# Patient Record
Sex: Female | Born: 1935 | Race: White | Hispanic: No | Smoking: Never smoker
Health system: Southern US, Community
[De-identification: ages and names within clinical notes are randomized; demographics above are authoritative.]

## PROBLEM LIST (undated history)

## (undated) DIAGNOSIS — E785 Hyperlipidemia, unspecified: Secondary | ICD-10-CM

## (undated) DIAGNOSIS — F32A Depression, unspecified: Secondary | ICD-10-CM

## (undated) DIAGNOSIS — I639 Cerebral infarction, unspecified: Secondary | ICD-10-CM

## (undated) DIAGNOSIS — E669 Obesity, unspecified: Secondary | ICD-10-CM

## (undated) DIAGNOSIS — Z8614 Personal history of Methicillin resistant Staphylococcus aureus infection: Secondary | ICD-10-CM

## (undated) DIAGNOSIS — M1990 Unspecified osteoarthritis, unspecified site: Secondary | ICD-10-CM

## (undated) DIAGNOSIS — E079 Disorder of thyroid, unspecified: Secondary | ICD-10-CM

## (undated) DIAGNOSIS — I272 Pulmonary hypertension, unspecified: Secondary | ICD-10-CM

## (undated) DIAGNOSIS — I10 Essential (primary) hypertension: Secondary | ICD-10-CM

## (undated) DIAGNOSIS — C801 Malignant (primary) neoplasm, unspecified: Secondary | ICD-10-CM

## (undated) DIAGNOSIS — F329 Major depressive disorder, single episode, unspecified: Secondary | ICD-10-CM

## (undated) HISTORY — DX: Unspecified osteoarthritis, unspecified site: M19.90

## (undated) HISTORY — DX: Pulmonary hypertension, unspecified: I27.20

## (undated) HISTORY — DX: Cerebral infarction, unspecified: I63.9

## (undated) HISTORY — DX: Obesity, unspecified: E66.9

## (undated) HISTORY — DX: Personal history of Methicillin resistant Staphylococcus aureus infection: Z86.14

## (undated) HISTORY — DX: Disorder of thyroid, unspecified: E07.9

## (undated) HISTORY — DX: Essential (primary) hypertension: I10

## (undated) HISTORY — DX: Depression, unspecified: F32.A

## (undated) HISTORY — DX: Major depressive disorder, single episode, unspecified: F32.9

## (undated) HISTORY — DX: Hyperlipidemia, unspecified: E78.5

## (undated) HISTORY — PX: PACEMAKER INSERTION: SHX728

## (undated) HISTORY — DX: Malignant (primary) neoplasm, unspecified: C80.1

---

## 1962-01-06 HISTORY — PX: CHOLECYSTECTOMY: SHX55

## 2001-01-06 HISTORY — PX: KNEE SURGERY: SHX244

## 2001-01-06 HISTORY — PX: COLONOSCOPY: SHX174

## 2003-01-07 HISTORY — PX: JOINT REPLACEMENT: SHX530

## 2003-10-07 ENCOUNTER — Encounter: Payer: Self-pay | Admitting: Internal Medicine

## 2004-01-07 HISTORY — PX: FOOT SURGERY: SHX648

## 2004-06-03 ENCOUNTER — Ambulatory Visit: Payer: Self-pay | Admitting: Internal Medicine

## 2004-06-18 ENCOUNTER — Ambulatory Visit: Payer: Self-pay | Admitting: Internal Medicine

## 2004-08-20 ENCOUNTER — Ambulatory Visit: Payer: Self-pay | Admitting: Otolaryngology

## 2005-01-06 HISTORY — PX: SHOULDER SURGERY: SHX246

## 2005-08-25 ENCOUNTER — Ambulatory Visit: Payer: Self-pay | Admitting: Internal Medicine

## 2005-10-28 ENCOUNTER — Ambulatory Visit: Payer: Self-pay | Admitting: Gastroenterology

## 2006-02-14 ENCOUNTER — Emergency Department: Payer: Self-pay | Admitting: Emergency Medicine

## 2006-02-14 ENCOUNTER — Other Ambulatory Visit: Payer: Self-pay

## 2006-10-29 ENCOUNTER — Ambulatory Visit: Payer: Self-pay | Admitting: Internal Medicine

## 2007-02-15 ENCOUNTER — Ambulatory Visit: Payer: Self-pay | Admitting: Internal Medicine

## 2007-11-04 ENCOUNTER — Ambulatory Visit: Payer: Self-pay | Admitting: Internal Medicine

## 2008-01-07 DIAGNOSIS — C801 Malignant (primary) neoplasm, unspecified: Secondary | ICD-10-CM

## 2008-01-07 HISTORY — DX: Malignant (primary) neoplasm, unspecified: C80.1

## 2008-01-07 HISTORY — PX: BREAST SURGERY: SHX581

## 2008-01-14 ENCOUNTER — Ambulatory Visit: Payer: Self-pay | Admitting: Internal Medicine

## 2008-11-09 ENCOUNTER — Ambulatory Visit: Payer: Self-pay | Admitting: Internal Medicine

## 2008-11-16 ENCOUNTER — Ambulatory Visit: Payer: Self-pay | Admitting: Internal Medicine

## 2008-11-21 ENCOUNTER — Ambulatory Visit: Payer: Self-pay | Admitting: Internal Medicine

## 2008-12-26 ENCOUNTER — Ambulatory Visit: Payer: Self-pay | Admitting: Cardiology

## 2008-12-27 ENCOUNTER — Ambulatory Visit: Payer: Self-pay | Admitting: Cardiology

## 2008-12-28 ENCOUNTER — Ambulatory Visit: Payer: Self-pay | Admitting: General Surgery

## 2009-01-02 ENCOUNTER — Inpatient Hospital Stay: Payer: Self-pay | Admitting: General Surgery

## 2009-01-06 ENCOUNTER — Ambulatory Visit: Payer: Self-pay | Admitting: Oncology

## 2009-01-29 ENCOUNTER — Ambulatory Visit: Payer: Self-pay | Admitting: Oncology

## 2009-02-06 ENCOUNTER — Ambulatory Visit: Payer: Self-pay | Admitting: Oncology

## 2009-02-08 ENCOUNTER — Ambulatory Visit: Payer: Self-pay | Admitting: General Surgery

## 2009-03-05 ENCOUNTER — Ambulatory Visit: Payer: Self-pay | Admitting: Oncology

## 2009-03-06 ENCOUNTER — Ambulatory Visit: Payer: Self-pay | Admitting: Oncology

## 2009-04-05 ENCOUNTER — Ambulatory Visit: Payer: Self-pay | Admitting: Internal Medicine

## 2009-05-06 ENCOUNTER — Ambulatory Visit: Payer: Self-pay | Admitting: Oncology

## 2009-05-23 ENCOUNTER — Encounter: Payer: Self-pay | Admitting: Internal Medicine

## 2009-06-05 ENCOUNTER — Ambulatory Visit: Payer: Self-pay | Admitting: Oncology

## 2009-06-06 ENCOUNTER — Encounter: Payer: Self-pay | Admitting: Internal Medicine

## 2009-07-06 ENCOUNTER — Encounter: Payer: Self-pay | Admitting: Internal Medicine

## 2009-08-06 ENCOUNTER — Encounter: Payer: Self-pay | Admitting: Internal Medicine

## 2009-09-06 ENCOUNTER — Encounter: Payer: Self-pay | Admitting: Internal Medicine

## 2009-09-11 ENCOUNTER — Ambulatory Visit: Payer: Self-pay | Admitting: Oncology

## 2009-10-06 ENCOUNTER — Ambulatory Visit: Payer: Self-pay | Admitting: Oncology

## 2009-10-06 ENCOUNTER — Encounter: Payer: Self-pay | Admitting: Internal Medicine

## 2009-12-12 ENCOUNTER — Ambulatory Visit: Payer: Self-pay | Admitting: Oncology

## 2009-12-19 ENCOUNTER — Ambulatory Visit: Payer: Self-pay | Admitting: Internal Medicine

## 2010-01-06 ENCOUNTER — Ambulatory Visit: Payer: Self-pay | Admitting: Oncology

## 2010-01-06 HISTORY — PX: SKIN CANCER EXCISION: SHX779

## 2010-04-16 ENCOUNTER — Ambulatory Visit: Payer: Self-pay | Admitting: Oncology

## 2010-06-13 ENCOUNTER — Ambulatory Visit: Payer: Self-pay | Admitting: Oncology

## 2010-07-07 ENCOUNTER — Ambulatory Visit: Payer: Self-pay | Admitting: Oncology

## 2010-07-23 ENCOUNTER — Ambulatory Visit: Payer: Self-pay | Admitting: General Surgery

## 2010-08-08 ENCOUNTER — Ambulatory Visit: Payer: Self-pay | Admitting: General Surgery

## 2010-08-27 ENCOUNTER — Other Ambulatory Visit: Payer: Self-pay | Admitting: Internal Medicine

## 2010-08-27 MED ORDER — LEVOTHYROXINE SODIUM 125 MCG PO TABS: 125.0000 ug | ORAL_TABLET | Freq: Every day | ORAL | Status: DC

## 2010-08-27 MED ORDER — LOSARTAN POTASSIUM 100 MG PO TABS: 100.0000 mg | ORAL_TABLET | Freq: Every day | ORAL | Status: DC

## 2010-09-16 ENCOUNTER — Ambulatory Visit: Payer: Medicare Other

## 2010-10-17 ENCOUNTER — Ambulatory Visit (INDEPENDENT_AMBULATORY_CARE_PROVIDER_SITE_OTHER): Payer: Medicare Other | Admitting: Internal Medicine

## 2010-10-17 ENCOUNTER — Encounter: Payer: Self-pay | Admitting: Internal Medicine

## 2010-10-17 VITALS — BP 138/82 | HR 60 | Temp 98.7°F | Resp 20 | Wt 199.8 lb

## 2010-10-17 DIAGNOSIS — E669 Obesity, unspecified: Secondary | ICD-10-CM

## 2010-10-17 DIAGNOSIS — F329 Major depressive disorder, single episode, unspecified: Secondary | ICD-10-CM | POA: Insufficient documentation

## 2010-10-17 DIAGNOSIS — F32A Depression, unspecified: Secondary | ICD-10-CM | POA: Insufficient documentation

## 2010-10-17 NOTE — Progress Notes (Signed)
Subjective:    Patient ID: Bianca Gomez, female    DOB: 1927/09/11, 77 y.o.   MRN: [id number]  HPI Bianca Gomez is an 77 year old female who presents for followup. She notes recent worsening of her depression in the setting of low self-esteem related to her obesity. She reports that her obesity significantly limits her ability to function. She notes that she is now short of breath walking from her house to her car. Because of neuropathy in her arms and legs her ability to exercise is limited. She is interested in learning about diet and exercise interventions to help her lose weight. She has been considering enrolling in a swimming program.  Outpatient Encounter Prescriptions as of 10/17/2010  Medication Sig Dispense Refill  . aspirin 81 MG tablet Take 81 mg by mouth daily.        . Calcium Carbonate-Vitamin D (CALCIUM 600-D) 600-400 MG-UNIT per tablet Take 2 tablets by mouth daily.        . Cholecalciferol (VITAMIN D) 2000 UNITS CAPS Take 1 capsule by mouth daily.        . fluticasone (FLONASE) 50 MCG/ACT nasal spray Place 2 sprays into the nose daily.        . folic acid (FOLVITE) 400 MCG tablet Take 400 mcg by mouth daily.        Marland Kitchen letrozole (FEMARA) 2.5 MG tablet Take 2.5 mg by mouth daily.        Marland Kitchen levothyroxine (SYNTHROID, LEVOTHROID) 125 MCG tablet Take 1 tablet (125 mcg total) by mouth daily.  30 tablet  6  . losartan (COZAAR) 100 MG tablet Take 1 tablet (100 mg total) by mouth daily.  30 tablet  6  . Multiple Vitamin (MULTIVITAMIN) capsule Take 1 capsule by mouth daily.        . Oxcarbazepine (TRILEPTAL) 300 MG tablet Take 300 mg by mouth 2 (two) times daily.        . sertraline (ZOLOFT) 100 MG tablet Take 100 mg by mouth daily. Takes 2 at bedtime        . vitamin C (ASCORBIC ACID) 500 MG tablet Take 500 mg by mouth daily.          Review of Systems  Constitutional: Negative for fever, chills, appetite change, fatigue and unexpected weight change.  HENT: Negative for ear pain,  congestion, sore throat, trouble swallowing, neck pain, voice change and sinus pressure.   Eyes: Negative for visual disturbance.  Respiratory: Positive for shortness of breath (on exertion). Negative for cough, wheezing and stridor.   Cardiovascular: Negative for chest pain, palpitations and leg swelling.  Gastrointestinal: Negative for nausea, vomiting, abdominal pain, diarrhea, constipation, blood in stool, abdominal distention and anal bleeding.  Genitourinary: Negative for dysuria and flank pain.  Musculoskeletal: Positive for gait problem. Negative for myalgias and arthralgias.  Skin: Negative for color change and rash.  Neurological: Positive for tremors and weakness. Negative for dizziness and headaches.  Hematological: Negative for adenopathy. Does not bruise/bleed easily.  Psychiatric/Behavioral: Positive for dysphoric mood. Negative for suicidal ideas and sleep disturbance. The patient is nervous/anxious.    BP 138/82  Pulse 60  Temp(Src) 98.7 F (37.1 C) (Oral)  Resp 20  Wt 199 lb 12 oz (90.606 kg)  SpO2 97%     Objective:   Physical Exam  Constitutional: She is oriented to person, place, and time. She appears well-developed and well-nourished. No distress.  HENT:  Head: Normocephalic and atraumatic.  Right Ear: External ear normal.  Left Ear:  External ear normal.  Nose: Nose normal.  Mouth/Throat: Oropharynx is clear and moist. No oropharyngeal exudate.  Eyes: Conjunctivae are normal. Pupils are equal, round, and reactive to light. Right eye exhibits no discharge. Left eye exhibits no discharge. No scleral icterus.  Neck: Normal range of motion. Neck supple. No tracheal deviation present. No thyromegaly present.  Cardiovascular: Normal rate, regular rhythm, normal heart sounds and intact distal pulses.  Exam reveals no gallop and no friction rub.   No murmur heard. Pulmonary/Chest: Effort normal and breath sounds normal. No respiratory distress. She has no wheezes. She  has no rales. She exhibits no tenderness.  Musculoskeletal: Normal range of motion. She exhibits no edema and no tenderness.  Lymphadenopathy:    She has no cervical adenopathy.  Neurological: She is alert and oriented to person, place, and time. No cranial nerve deficit. She exhibits normal muscle tone. Coordination normal.  Skin: Skin is warm and dry. No rash noted. She is not diaphoretic. No erythema. No pallor.  Psychiatric: Her speech is normal and behavior is normal. Judgment and thought content normal. Cognition and memory are normal. She exhibits a depressed mood.          Assessment & Plan:  1. Obesity -discussed options today to help her with diet and exercise. Encouraged her to count calories with a goal calorie intake per day of 1000-1200. Gave her information on a local aquatic center. Also gave information on a local personal trainer which might be of assistance to her. We discussed the option of physical and occupational therapy, however she has done this in the past and does not wish to do this again. We will have her followup in one month.   2. Depression -Worsened in the setting of low self-esteem because of limited ability to function. Gave her reference of the book "Feeling good" by Lawerance Bach for her to review. This is a book on cognitive behavioral therapy. She is already taking Zoloft. She has been followed by psychiatry in the past. She would prefer not to add additional medications at this time. We will have her followup in one month.

## 2010-11-13 ENCOUNTER — Ambulatory Visit (INDEPENDENT_AMBULATORY_CARE_PROVIDER_SITE_OTHER): Payer: Medicare Other | Admitting: Internal Medicine

## 2010-11-13 DIAGNOSIS — M810 Age-related osteoporosis without current pathological fracture: Secondary | ICD-10-CM

## 2010-11-13 MED ORDER — DENOSUMAB 60 MG/ML ~~LOC~~ SOLN
60.0000 mg | Freq: Once | SUBCUTANEOUS | Status: AC
  Administered 2010-11-13: 60 mg via SUBCUTANEOUS

## 2011-01-07 DIAGNOSIS — I272 Pulmonary hypertension, unspecified: Secondary | ICD-10-CM

## 2011-01-07 HISTORY — PX: PHRENIC NERVE PACEMAKER IMPLANTATION: SHX2237

## 2011-01-07 HISTORY — DX: Pulmonary hypertension, unspecified: I27.20

## 2011-01-16 ENCOUNTER — Ambulatory Visit: Payer: Self-pay | Admitting: Oncology

## 2011-01-20 ENCOUNTER — Telehealth: Payer: Self-pay | Admitting: Internal Medicine

## 2011-01-20 NOTE — Telephone Encounter (Signed)
Work  [phone] After  12    [phone]  Pt called has questions on her meds.  And has ? On PA, pt would like to check with dr walker about some the things the PA wants her to do Pt would like appointment before Thursday

## 2011-01-21 NOTE — Telephone Encounter (Signed)
Yes, she should take her thyroid medication with water only

## 2011-01-21 NOTE — Telephone Encounter (Signed)
Pt called back this morning wanted to speak to dr walkers nurse w [phone]    After 1 [phone]

## 2011-01-21 NOTE — Telephone Encounter (Signed)
Patient notified

## 2011-01-21 NOTE — Telephone Encounter (Signed)
Pharm advised patient to avoid calcium 4 hours after taking thyroid med. She will take her calcium at a different time but should she avoid her regular dairy intake also?

## 2011-01-23 ENCOUNTER — Other Ambulatory Visit: Payer: Self-pay | Admitting: *Deleted

## 2011-01-23 MED ORDER — SERTRALINE HCL 100 MG PO TABS: 100.0000 mg | ORAL_TABLET | Freq: Every day | ORAL | Status: DC

## 2011-01-23 MED ORDER — OXCARBAZEPINE 300 MG PO TABS: ORAL_TABLET | ORAL | Status: DC

## 2011-02-03 ENCOUNTER — Ambulatory Visit (INDEPENDENT_AMBULATORY_CARE_PROVIDER_SITE_OTHER)
Admission: RE | Admit: 2011-02-03 | Discharge: 2011-02-03 | Disposition: A | Discharge status: Home / Self Care | Payer: Medicare Other | Source: Ambulatory Visit | Attending: Internal Medicine | Admitting: Internal Medicine

## 2011-02-03 ENCOUNTER — Ambulatory Visit (INDEPENDENT_AMBULATORY_CARE_PROVIDER_SITE_OTHER): Payer: Medicare Other | Admitting: Internal Medicine

## 2011-02-03 ENCOUNTER — Telehealth: Payer: Self-pay | Admitting: *Deleted

## 2011-02-03 ENCOUNTER — Encounter: Payer: Self-pay | Admitting: Internal Medicine

## 2011-02-03 VITALS — BP 178/88 | HR 65 | Temp 98.6°F | Ht 60.0 in | Wt 211.0 lb

## 2011-02-03 DIAGNOSIS — M25512 Pain in left shoulder: Secondary | ICD-10-CM | POA: Insufficient documentation

## 2011-02-03 DIAGNOSIS — M25519 Pain in unspecified shoulder: Secondary | ICD-10-CM

## 2011-02-03 MED ORDER — HYDROCODONE-ACETAMINOPHEN 5-500 MG PO TABS: 1.0000 | ORAL_TABLET | Freq: Three times a day (TID) | ORAL | Status: AC | PRN

## 2011-02-03 NOTE — Telephone Encounter (Signed)
Ok for wk in - shoulder eval only at 1:15 today. Patient informed

## 2011-02-03 NOTE — Telephone Encounter (Signed)
Patient requesting work in apt today for shoulder pain. She fell last week and c/o increasing pain. Please advise.

## 2011-02-03 NOTE — Assessment & Plan Note (Signed)
S/p fall left upper arm.  Severe pain anterior left shoulder. Note, s/p left shoulder replacement.  Will get plain xray to evaluate fracture.  Will start vicodin prn pain.

## 2011-02-03 NOTE — Progress Notes (Signed)
Subjective:    Patient ID: Bianca Gomez, female    DOB: 1927/08/11, 78 y.o.   MRN: [id number]  HPI 78YO female with h/o neuropathy secondary to chemotherapy and s/p left shoulder replacement presents for acute visit after fall last Wednesday in which she tripped on sidewalk and fell onto left shoulder. Notes severe pain in left shoulder since that time. Unable to abduct arm. Decreased strength in biceps and triceps.  No LOC during fall. No other noted injuries.  Outpatient Encounter Prescriptions as of 02/03/2011  Medication Sig Dispense Refill  . aspirin 81 MG tablet Take 81 mg by mouth daily.        . Calcium Carbonate-Vitamin D (CALCIUM 600-D) 600-400 MG-UNIT per tablet Take 2 tablets by mouth daily.        . Cholecalciferol (VITAMIN D) 2000 UNITS CAPS Take 1 capsule by mouth daily.        . fluticasone (FLONASE) 50 MCG/ACT nasal spray Place 2 sprays into the nose daily.        . folic acid (FOLVITE) 400 MCG tablet Take 400 mcg by mouth daily.        Marland Kitchen letrozole (FEMARA) 2.5 MG tablet Take 2.5 mg by mouth daily.        Marland Kitchen levothyroxine (SYNTHROID, LEVOTHROID) 125 MCG tablet Take 1 tablet (125 mcg total) by mouth daily.  30 tablet  6  . losartan (COZAAR) 100 MG tablet Take 1 tablet (100 mg total) by mouth daily.  30 tablet  6  . Multiple Vitamin (MULTIVITAMIN) capsule Take 1 capsule by mouth daily.        . Oxcarbazepine (TRILEPTAL) 300 MG tablet 1 tablet in the AM and 2 in the evening  270 tablet  1  . sertraline (ZOLOFT) 100 MG tablet Take 1 tablet (100 mg total) by mouth daily. Takes 2 at bedtime  90 tablet  1  . vitamin C (ASCORBIC ACID) 500 MG tablet Take 500 mg by mouth daily.        Marland Kitchen HYDROcodone-acetaminophen (VICODIN) 5-500 MG per tablet Take 1 tablet by mouth every 8 (eight) hours as needed for pain.  20 tablet  0    Review of Systems  Constitutional: Negative for fever, chills and fatigue.  HENT: Negative for neck pain and neck stiffness.   Respiratory: Negative for chest  tightness and shortness of breath.   Cardiovascular: Negative for chest pain.  Musculoskeletal: Positive for myalgias and arthralgias.  Skin: Negative for color change and wound.  Neurological: Positive for weakness and numbness. Negative for light-headedness.   BP 178/88  Pulse 65  Temp(Src) 98.6 F (37 C) (Oral)  Ht 5' (1.524 m)  Wt 211 lb (95.709 kg)  BMI 41.21 kg/m2  SpO2 97%     Objective:   Physical Exam  Constitutional: She is oriented to person, place, and time. She appears well-developed and well-nourished. No distress.  HENT:  Head: Normocephalic and atraumatic.  Right Ear: External ear normal.  Left Ear: External ear normal.  Nose: Nose normal.  Mouth/Throat: Oropharynx is clear and moist. No oropharyngeal exudate.  Eyes: Conjunctivae are normal. Pupils are equal, round, and reactive to light. Right eye exhibits no discharge. Left eye exhibits no discharge. No scleral icterus.  Neck: Normal range of motion. Neck supple. No tracheal deviation present. No thyromegaly present.  Cardiovascular: Normal rate, regular rhythm, normal heart sounds and intact distal pulses.  Exam reveals no gallop and no friction rub.   No murmur heard. Pulmonary/Chest: Effort normal  and breath sounds normal. No respiratory distress. She has no wheezes. She has no rales. She exhibits no tenderness.  Musculoskeletal: She exhibits no edema and no tenderness.       Left shoulder: She exhibits decreased range of motion, tenderness, bony tenderness, pain and decreased strength (4/5 biceps, triceps, shoulder abductors). She exhibits no swelling, no effusion and no crepitus.  Lymphadenopathy:    She has no cervical adenopathy.  Neurological: She is alert and oriented to person, place, and time. No cranial nerve deficit. She exhibits normal muscle tone. Coordination normal.  Skin: Skin is warm and dry. No rash noted. She is not diaphoretic. No erythema. No pallor.  Psychiatric: She has a normal mood and  affect. Her behavior is normal. Judgment and thought content normal.          Assessment & Plan:

## 2011-02-03 NOTE — Telephone Encounter (Signed)
Opened in error

## 2011-02-04 ENCOUNTER — Telehealth: Payer: Self-pay | Admitting: Internal Medicine

## 2011-02-04 DIAGNOSIS — S4990XA Unspecified injury of shoulder and upper arm, unspecified arm, initial encounter: Secondary | ICD-10-CM

## 2011-02-04 DIAGNOSIS — M25512 Pain in left shoulder: Secondary | ICD-10-CM

## 2011-02-04 NOTE — Telephone Encounter (Signed)
Call patient at work with results.

## 2011-02-04 NOTE — Telephone Encounter (Signed)
No one at wk #, left pt vm on hm # that ortho referral was in place and she would be called w/details

## 2011-02-07 ENCOUNTER — Ambulatory Visit: Payer: Self-pay | Admitting: Oncology

## 2011-03-26 ENCOUNTER — Ambulatory Visit: Payer: Self-pay | Admitting: Internal Medicine

## 2011-03-26 ENCOUNTER — Telehealth: Payer: Self-pay | Admitting: *Deleted

## 2011-03-26 ENCOUNTER — Encounter: Payer: Self-pay | Admitting: Internal Medicine

## 2011-03-26 ENCOUNTER — Ambulatory Visit (INDEPENDENT_AMBULATORY_CARE_PROVIDER_SITE_OTHER): Payer: Medicare Other | Admitting: Internal Medicine

## 2011-03-26 VITALS — BP 172/80 | HR 67 | Temp 97.9°F | Wt 207.0 lb

## 2011-03-26 DIAGNOSIS — S0990XA Unspecified injury of head, initial encounter: Secondary | ICD-10-CM

## 2011-03-26 NOTE — Telephone Encounter (Signed)
Can you please let pt know

## 2011-03-26 NOTE — Telephone Encounter (Signed)
Call report - No acute intracranial changes, there are involutional changes. Report will be faxed over.

## 2011-03-26 NOTE — Assessment & Plan Note (Signed)
Patient is status post fall from standing position. She has large ecchymosis on her anterior for head and has complaints of blurred vision. Symptoms are concerning for subdural hematoma. Will get stat noncontrast CT of the head. She will continue to apply ice to her anterior forehead and left upper arm. We applied a Tegaderm dressing to her skin tear on her right knee today. She will followup in one week if the CT is negative. She will call immediately if symptoms of blurred vision are worsening or she develops new symptoms such as headache, nausea.

## 2011-03-26 NOTE — Progress Notes (Signed)
Subjective:    Patient ID: Bianca Gomez, female    DOB: July 25, 1927, 78 y.o.   MRN: [id number]  HPI  78 year old female presents as a walk-in acute visit after falling this morning at home at approximately 7 AM. She reports that she tripped on some carpet in her home and fell forward landing on her left arm and head. She reports that she fell hard enough that her head bounced off the floor. She has a large bruise and swelling over her forehead. She has been applying ice to this. She denies any loss of consciousness, nausea, or vomiting. She denies chest pain or shortness of breath. She does note some blurred vision. She also scratched her knee and notes some bleeding from this area.  Outpatient Encounter Prescriptions as of 03/26/2011  Medication Sig Dispense Refill  . aspirin 81 MG tablet Take 81 mg by mouth daily.        . Calcium Carbonate-Vitamin D (CALCIUM 600-D) 600-400 MG-UNIT per tablet Take 2 tablets by mouth daily.        . Cholecalciferol (VITAMIN D) 2000 UNITS CAPS Take 1 capsule by mouth daily.        . fluticasone (FLONASE) 50 MCG/ACT nasal spray Place 2 sprays into the nose daily.        . folic acid (FOLVITE) 400 MCG tablet Take 400 mcg by mouth daily.        Marland Kitchen letrozole (FEMARA) 2.5 MG tablet Take 2.5 mg by mouth daily.        Marland Kitchen levothyroxine (SYNTHROID, LEVOTHROID) 125 MCG tablet Take 1 tablet (125 mcg total) by mouth daily.  30 tablet  6  . losartan (COZAAR) 100 MG tablet Take 1 tablet (100 mg total) by mouth daily.  30 tablet  6  . Multiple Vitamin (MULTIVITAMIN) capsule Take 1 capsule by mouth daily.        . Oxcarbazepine (TRILEPTAL) 300 MG tablet 1 tablet in the AM and 2 in the evening  270 tablet  1  . sertraline (ZOLOFT) 100 MG tablet Take 1 tablet (100 mg total) by mouth daily. Takes 2 at bedtime  90 tablet  1  . vitamin C (ASCORBIC ACID) 500 MG tablet Take 500 mg by mouth daily.          Review of Systems  Constitutional: Negative for fever, chills, appetite  change, fatigue and unexpected weight change.  HENT: Negative for neck pain.   Eyes: Positive for visual disturbance. Negative for pain.  Respiratory: Negative for cough, shortness of breath, wheezing and stridor.   Cardiovascular: Negative for chest pain and palpitations.  Musculoskeletal: Positive for myalgias. Negative for arthralgias and gait problem.  Skin: Positive for color change and wound. Negative for rash.  Neurological: Positive for headaches. Negative for dizziness, tremors, speech difficulty, weakness and light-headedness.  Hematological: Negative for adenopathy. Bruises/bleeds easily.  Psychiatric/Behavioral: Negative for suicidal ideas, sleep disturbance and dysphoric mood. The patient is not nervous/anxious.    BP 172/80  Pulse 67  Temp(Src) 97.9 F (36.6 C) (Oral)  Wt 207 lb (93.895 kg)  SpO2 97%     Objective:   Physical Exam  Constitutional: She is oriented to person, place, and time. She appears well-developed and well-nourished. No distress.  HENT:  Head: Normocephalic and atraumatic.    Right Ear: External ear normal.  Left Ear: External ear normal.  Nose: Nose normal.  Mouth/Throat: Oropharynx is clear and moist. No oropharyngeal exudate.  Eyes: Conjunctivae are normal. Pupils are equal, round,  and reactive to light. Right eye exhibits no discharge. Left eye exhibits no discharge. No scleral icterus.  Neck: Normal range of motion. Neck supple. No tracheal deviation present. No thyromegaly present.  Cardiovascular: Normal rate, regular rhythm, normal heart sounds and intact distal pulses.  Exam reveals no gallop and no friction rub.   No murmur heard. Pulmonary/Chest: Effort normal and breath sounds normal. No respiratory distress. She has no wheezes. She has no rales. She exhibits no tenderness.  Musculoskeletal: Normal range of motion. She exhibits no edema and no tenderness.  Lymphadenopathy:    She has no cervical adenopathy.  Neurological: She is  alert and oriented to person, place, and time. No cranial nerve deficit. She exhibits normal muscle tone. Coordination normal.  Skin: Skin is warm and dry. Bruising, ecchymosis and laceration noted. No rash noted. She is not diaphoretic. There is erythema. No pallor.     Psychiatric: She has a normal mood and affect. Her behavior is normal. Judgment and thought content normal.          Assessment & Plan:

## 2011-03-27 NOTE — Telephone Encounter (Signed)
Patient informed, She is sore today but overall ok. Pt advised to call w/any changes

## 2011-03-31 ENCOUNTER — Telehealth: Payer: Self-pay | Admitting: Internal Medicine

## 2011-03-31 NOTE — Telephone Encounter (Signed)
Left message notifying patient.

## 2011-03-31 NOTE — Telephone Encounter (Signed)
[phone] Pt wanted to know if you still want her to follow in one week.  We made appointment for 4/3 @ 1:15 it this ok or do you need to see her sooner?

## 2011-03-31 NOTE — Telephone Encounter (Signed)
If she is feeling okay, then we can wait until 4/3

## 2011-04-01 ENCOUNTER — Encounter: Payer: Self-pay | Admitting: Internal Medicine

## 2011-04-09 ENCOUNTER — Ambulatory Visit (INDEPENDENT_AMBULATORY_CARE_PROVIDER_SITE_OTHER): Payer: Medicare Other | Admitting: Internal Medicine

## 2011-04-09 ENCOUNTER — Telehealth: Payer: Self-pay | Admitting: Internal Medicine

## 2011-04-09 ENCOUNTER — Encounter: Payer: Self-pay | Admitting: Internal Medicine

## 2011-04-09 VITALS — BP 118/62 | HR 73 | Temp 98.4°F | Ht 60.0 in | Wt 211.0 lb

## 2011-04-09 DIAGNOSIS — E039 Hypothyroidism, unspecified: Secondary | ICD-10-CM

## 2011-04-09 DIAGNOSIS — R519 Headache, unspecified: Secondary | ICD-10-CM | POA: Insufficient documentation

## 2011-04-09 DIAGNOSIS — R2681 Unsteadiness on feet: Secondary | ICD-10-CM

## 2011-04-09 DIAGNOSIS — D649 Anemia, unspecified: Secondary | ICD-10-CM

## 2011-04-09 DIAGNOSIS — R269 Unspecified abnormalities of gait and mobility: Secondary | ICD-10-CM

## 2011-04-09 DIAGNOSIS — E785 Hyperlipidemia, unspecified: Secondary | ICD-10-CM

## 2011-04-09 DIAGNOSIS — R51 Headache: Secondary | ICD-10-CM | POA: Insufficient documentation

## 2011-04-09 LAB — COMPREHENSIVE METABOLIC PANEL
Albumin: 3.8 g/dL (ref 3.5–5.2)
Alkaline Phosphatase: 74 U/L (ref 39–117)
BUN: 14 mg/dL (ref 6–23)
CO2: 25 mEq/L (ref 19–32)
Chloride: 96 mEq/L (ref 96–112)
GFR: 92.91 mL/min (ref >60.00)
Glucose, Bld: 126 mg/dL — ABNORMAL HIGH (ref 70–99)
Total Bilirubin: 0.5 mg/dL (ref 0.3–1.2)
Total Protein: 7.2 g/dL (ref 6.0–8.3)

## 2011-04-09 LAB — CBC WITH DIFFERENTIAL/PLATELET
Basophils Absolute: 0 10*3/uL (ref 0.0–0.1)
HCT: 38 % (ref 36.0–46.0)
Lymphocytes Relative: 10.5 % — ABNORMAL LOW (ref 12.0–46.0)
Lymphs Abs: 0.5 10*3/uL — ABNORMAL LOW (ref 0.7–4.0)
Monocytes Relative: 11.9 % (ref 3.0–12.0)
Platelets: 162 10*3/uL (ref 150.0–400.0)
RDW: 13.2 % (ref 11.5–14.6)

## 2011-04-09 LAB — LIPID PANEL
Cholesterol: 157 mg/dL (ref 0–200)
Triglycerides: 67 mg/dL (ref 0.0–149.0)

## 2011-04-09 LAB — TSH: TSH: 0.65 u[IU]/mL (ref 0.35–5.50)

## 2011-04-09 NOTE — Telephone Encounter (Signed)
She also has history of dizziness/recurrent falls.  That should work for diagnosis.  Thanks for setting this up.

## 2011-04-09 NOTE — Assessment & Plan Note (Addendum)
Daily diffuse headache. Suspect related to stress/tension. Recent CT head after fall was normal. Question if she might be anemic from extensive bruising.  Will check CBC, CMP, TSH with labs. Will continue prn tylenol/vicodin for severe headache. Follow up 1 month.

## 2011-04-09 NOTE — Assessment & Plan Note (Addendum)
Chronic. Patient went through vestibular therapy with minimal improvement. Continues to have some lightheadedness and gait instability with recurrent falls. Will get carotid Dopplers to evaluate for TIA. Recent CT brain was normal. Consider MRI brain if symptoms persistent.

## 2011-04-09 NOTE — Telephone Encounter (Signed)
Called [facility] for the cartoid dopplers.  They said the diagnosis of headache would not be a payable diagnosis for the insurance for the dopplers.  Could not find anything in the notes.  Would you ask Dr Dan Humphreys how soon these need to be done and is there another reason other than just headache

## 2011-04-09 NOTE — Progress Notes (Signed)
Subjective:    Patient ID: Bianca Gomez, female    DOB: 30-Sep-1927, 78 y.o.   MRN: [id number]  HPI 78YO female with h/o hypertension, hypothyroidism and recent fall 2 weeks ago presents for follow up. She notes that bruising from her fall over her forehead and upper left arm has improved. Her CT head was normal.  She notes that over the last several weeks, she has had almost daily headaches, described as diffuse. She has also had some gait instability and recurrent falls. She previously went through vestibular therapy with some improvement. She denies any neck pain. She denies any visual changes or preceding aura for her headaches. She denies any nausea or vomiting associated with headaches. She denies chest pain or shortness of breath.  Outpatient Encounter Prescriptions as of 04/09/2011  Medication Sig Dispense Refill  . aspirin 81 MG tablet Take 81 mg by mouth daily.        . Calcium Carbonate-Vitamin D (CALCIUM 600-D) 600-400 MG-UNIT per tablet Take 2 tablets by mouth daily.        . Cholecalciferol (VITAMIN D) 2000 UNITS CAPS Take 1 capsule by mouth daily.        . fluticasone (FLONASE) 50 MCG/ACT nasal spray Place 2 sprays into the nose daily.        . folic acid (FOLVITE) 400 MCG tablet Take 400 mcg by mouth daily.        Marland Kitchen letrozole (FEMARA) 2.5 MG tablet Take 2.5 mg by mouth daily.        Marland Kitchen levothyroxine (SYNTHROID, LEVOTHROID) 125 MCG tablet Take 1 tablet (125 mcg total) by mouth daily.  30 tablet  6  . losartan (COZAAR) 100 MG tablet Take 1 tablet (100 mg total) by mouth daily.  30 tablet  6  . Multiple Vitamin (MULTIVITAMIN) capsule Take 1 capsule by mouth daily.        . Oxcarbazepine (TRILEPTAL) 300 MG tablet 1 tablet in the AM and 2 in the evening  270 tablet  1  . sertraline (ZOLOFT) 100 MG tablet Take 1 tablet (100 mg total) by mouth daily. Takes 2 at bedtime  90 tablet  1  . vitamin C (ASCORBIC ACID) 500 MG tablet Take 500 mg by mouth daily.          Review of Systems    Constitutional: Positive for fatigue. Negative for fever, chills, appetite change and unexpected weight change.  HENT: Negative for ear pain, congestion, sore throat, trouble swallowing, neck pain, voice change and sinus pressure.   Eyes: Negative for visual disturbance.  Respiratory: Negative for cough, shortness of breath, wheezing and stridor.   Cardiovascular: Negative for chest pain, palpitations and leg swelling.  Gastrointestinal: Negative for nausea, vomiting, abdominal pain, diarrhea, constipation, blood in stool, abdominal distention and anal bleeding.  Genitourinary: Negative for dysuria and flank pain.  Musculoskeletal: Negative for myalgias, arthralgias and gait problem.  Skin: Negative for color change and rash.  Neurological: Positive for headaches. Negative for dizziness.  Hematological: Negative for adenopathy. Does not bruise/bleed easily.  Psychiatric/Behavioral: Negative for suicidal ideas, sleep disturbance and dysphoric mood. The patient is nervous/anxious.    BP 118/62  Pulse 73  Temp(Src) 98.4 F (36.9 C) (Oral)  Ht 5' (1.524 m)  Wt 211 lb (95.709 kg)  BMI 41.21 kg/m2  SpO2 95%     Objective:   Physical Exam  Constitutional: She is oriented to person, place, and time. She appears well-developed and well-nourished. No distress.  HENT:  Head:  Normocephalic and atraumatic.  Right Ear: External ear normal.  Left Ear: External ear normal.  Nose: Nose normal.  Mouth/Throat: Oropharynx is clear and moist. No oropharyngeal exudate.  Eyes: Conjunctivae are normal. Pupils are equal, round, and reactive to light. Right eye exhibits no discharge. Left eye exhibits no discharge. No scleral icterus.  Neck: Normal range of motion. Neck supple. No tracheal deviation present. No thyromegaly present.  Cardiovascular: Normal rate, regular rhythm, normal heart sounds and intact distal pulses.  Exam reveals no gallop and no friction rub.   No murmur heard. Pulmonary/Chest:  Effort normal and breath sounds normal. No respiratory distress. She has no wheezes. She has no rales. She exhibits no tenderness.  Abdominal: Soft. Bowel sounds are normal. She exhibits no distension. There is no tenderness.  Musculoskeletal: Normal range of motion. She exhibits no edema and no tenderness.  Lymphadenopathy:    She has no cervical adenopathy.  Neurological: She is alert and oriented to person, place, and time. No cranial nerve deficit. She exhibits normal muscle tone. Coordination normal.  Skin: Skin is warm and dry. Ecchymosis (over forehead and left upper arm) noted. No rash noted. She is not diaphoretic. No erythema. No pallor.  Psychiatric: She has a normal mood and affect. Her behavior is normal. Judgment and thought content normal.          Assessment & Plan:

## 2011-04-15 ENCOUNTER — Telehealth: Payer: Self-pay | Admitting: Internal Medicine

## 2011-04-15 NOTE — Telephone Encounter (Signed)
Carotid doppler right 50-69%, left 1-49% stenosis

## 2011-04-18 ENCOUNTER — Encounter: Payer: Self-pay | Admitting: Internal Medicine

## 2011-04-21 ENCOUNTER — Telehealth: Payer: Self-pay | Admitting: *Deleted

## 2011-04-21 NOTE — Telephone Encounter (Signed)
Patient notified. I have left a Ventolin Inhaler up front for her to pick up . She will call tomorrow for sooner appt if symptoms are no better or worsening.

## 2011-04-21 NOTE — Telephone Encounter (Signed)
Patient is feeling short winded she is only able to take short breathes, having some wheezing. She wasn't been feeling good all last week. She is coughing just a little, no fever. She has an appt on wed to see you. She is asking what she can do until she is seen. She says that a couple of years ago she had this problem and was given an inhaler to use and she felt that it really helped. She is asking if maybe she could get an inhaler, maybe even a sample if we have it. Please advise.

## 2011-04-21 NOTE — Telephone Encounter (Signed)
Fine to either give her a sample or call in Albuterol inhaler for her to use 2 puffs tid prn.  However, if symptoms severe/worsening, she probably needs to be seen sooner.

## 2011-04-23 ENCOUNTER — Encounter: Payer: Self-pay | Admitting: Internal Medicine

## 2011-04-23 ENCOUNTER — Inpatient Hospital Stay: Payer: Self-pay | Admitting: Internal Medicine

## 2011-04-23 ENCOUNTER — Ambulatory Visit (INDEPENDENT_AMBULATORY_CARE_PROVIDER_SITE_OTHER): Payer: Medicare Other | Admitting: Internal Medicine

## 2011-04-23 DIAGNOSIS — R0989 Other specified symptoms and signs involving the circulatory and respiratory systems: Secondary | ICD-10-CM

## 2011-04-23 DIAGNOSIS — R079 Chest pain, unspecified: Secondary | ICD-10-CM | POA: Insufficient documentation

## 2011-04-23 DIAGNOSIS — R0600 Dyspnea, unspecified: Secondary | ICD-10-CM

## 2011-04-23 LAB — COMPREHENSIVE METABOLIC PANEL
Albumin: 3.5 g/dL (ref 3.4–5.0)
Alkaline Phosphatase: 97 U/L (ref 50–136)
Anion Gap: 9 (ref 7–16)
BUN: 13 mg/dL (ref 7–18)
Bilirubin,Total: 0.5 mg/dL (ref 0.2–1.0)
Co2: 28 mmol/L (ref 21–32)
Creatinine: 0.61 mg/dL (ref 0.60–1.30)
EGFR (African American): >60
EGFR (Non-African Amer.): >60
Osmolality: 266 (ref 275–301)
SGOT(AST): 26 U/L (ref 15–37)
SGPT (ALT): 22 U/L
Sodium: 132 mmol/L — ABNORMAL LOW (ref 136–145)
Total Protein: 7.7 g/dL (ref 6.4–8.2)

## 2011-04-23 LAB — PRO B NATRIURETIC PEPTIDE: B-Type Natriuretic Peptide: 413 pg/mL (ref 0–450)

## 2011-04-23 LAB — CBC
HCT: 38.5 % (ref 35.0–47.0)
HGB: 12.8 g/dL (ref 12.0–16.0)
MCHC: 33.2 g/dL (ref 32.0–36.0)
MCV: 92 fL (ref 80–100)
Platelet: 190 10*3/uL (ref 150–440)
RBC: 4.17 10*6/uL (ref 3.80–5.20)
RDW: 13.5 % (ref 11.5–14.5)
WBC: 5.4 10*3/uL (ref 3.6–11.0)

## 2011-04-23 LAB — TROPONIN I: Troponin-I: <0.02 ng/mL

## 2011-04-23 LAB — CK TOTAL AND CKMB (NOT AT ARMC): CK, Total: 61 U/L (ref 21–215)

## 2011-04-23 NOTE — Assessment & Plan Note (Signed)
Marked dyspnea on exam. Air movement is generally good with only few crackles noted.  Symptoms are concerning for MI or PE given pt h/o breast CA and use of Femara. EKG shows no acute abnormality. Pt was placed on supplemental oxygen by , then by non-rebreather, and sent by EMS to [facility] ED for evaluation. Will need cardiac markers, CXR and CT or VQ scan to eval PE.

## 2011-04-23 NOTE — Assessment & Plan Note (Signed)
Pt c/o chest pain described as band-like pressure around anterior chest.  EKG shows no acute abnormalities. However, pt has multiple risk factors for CAD, and is markedly dyspneic. Pt was given Aspirin 325mg  po in office and placed on oxygen 2.5L [place]. Pt was sent by EMS to [facility] ED for further evaluation to include cardiac markers, CXR, and likely CT chest given concern for PE.

## 2011-04-23 NOTE — Progress Notes (Signed)
Subjective:    Patient ID: Bianca Gomez, female    DOB: 11-17-1927, 78 y.o.   MRN: [id number]  HPI 78 year old female with history of hypertension and hypothyroidism as well as breast cancer status post mastectomy presents for an acute visit complaining of two-week history of shortness of breath and wheezing. She initially started an albuterol inhaler at home with some improvement. However, this morning she is markedly dyspneic. She has difficulty speaking more than 2 words at a time. She notes tightness across her anterior chest which she describes as bandlike. She has not taken any medication for this. She also notes nausea but no vomiting. She is diaphoretic.  Outpatient Encounter Prescriptions as of 04/23/2011  Medication Sig Dispense Refill  . aspirin 81 MG tablet Take 81 mg by mouth daily.        . Calcium Carbonate-Vitamin D (CALCIUM 600-D) 600-400 MG-UNIT per tablet Take 2 tablets by mouth daily.        . Cholecalciferol (VITAMIN D) 2000 UNITS CAPS Take 1 capsule by mouth daily.        . fluticasone (FLONASE) 50 MCG/ACT nasal spray Place 2 sprays into the nose daily.        . folic acid (FOLVITE) 400 MCG tablet Take 400 mcg by mouth daily.        Marland Kitchen letrozole (FEMARA) 2.5 MG tablet Take 2.5 mg by mouth daily.        Marland Kitchen levothyroxine (SYNTHROID, LEVOTHROID) 125 MCG tablet Take 1 tablet (125 mcg total) by mouth daily.  30 tablet  6  . losartan (COZAAR) 100 MG tablet Take 1 tablet (100 mg total) by mouth daily.  30 tablet  6  . Multiple Vitamin (MULTIVITAMIN) capsule Take 1 capsule by mouth daily.        . Oxcarbazepine (TRILEPTAL) 300 MG tablet 1 tablet in the AM and 2 in the evening  270 tablet  1  . sertraline (ZOLOFT) 100 MG tablet Take 1 tablet (100 mg total) by mouth daily. Takes 2 at bedtime  90 tablet  1  . vitamin C (ASCORBIC ACID) 500 MG tablet Take 500 mg by mouth daily.          Review of Systems  Constitutional: Positive for fatigue. Negative for fever and chills.    Respiratory: Positive for cough, shortness of breath and wheezing.   Cardiovascular: Positive for chest pain. Negative for palpitations and leg swelling.  Gastrointestinal: Positive for nausea. Negative for vomiting and abdominal pain.  Skin: Negative for color change.   BP 168/80  Pulse 68  Temp(Src) 98.1 F (36.7 C) (Oral)  Ht 5' (1.524 m)  Wt 203 lb (92.08 kg)  BMI 39.65 kg/m2  SpO2 98% RR 30     Objective:   Physical Exam  Constitutional: She is oriented to person, place, and time. She appears well-developed and well-nourished. She has a sickly appearance. She appears ill. She appears distressed.  HENT:  Head: Normocephalic and atraumatic.  Right Ear: External ear normal.  Left Ear: External ear normal.  Nose: Nose normal.  Mouth/Throat: Oropharynx is clear and moist. No oropharyngeal exudate.  Eyes: Conjunctivae are normal. Pupils are equal, round, and reactive to light. Right eye exhibits no discharge. Left eye exhibits no discharge. No scleral icterus.  Neck: Normal range of motion. Neck supple. No tracheal deviation present. No thyromegaly present.  Cardiovascular: Normal rate, regular rhythm, normal heart sounds and intact distal pulses.  Exam reveals no gallop and no friction rub.  No murmur heard. Pulmonary/Chest: Accessory muscle usage present. Tachypnea noted. She is in respiratory distress. She has no decreased breath sounds. She has no wheezes. She has rales. She exhibits no tenderness.  Abdominal: Soft. She exhibits no distension. There is no tenderness.  Musculoskeletal: Normal range of motion. She exhibits no edema and no tenderness.  Lymphadenopathy:    She has no cervical adenopathy.  Neurological: She is alert and oriented to person, place, and time. No cranial nerve deficit. She exhibits normal muscle tone. Coordination normal.  Skin: Skin is warm. No rash noted. She is diaphoretic. No erythema. No pallor.  Psychiatric: Her speech is normal and behavior is  normal. Judgment and thought content normal. Her mood appears anxious.          Assessment & Plan:

## 2011-04-24 LAB — LIPID PANEL: Cholesterol: 146 mg/dL (ref 0–200)

## 2011-04-24 LAB — CBC WITH DIFFERENTIAL/PLATELET
Basophil #: 0 10*3/uL (ref 0.0–0.1)
Basophil %: 0.3 %
Eosinophil %: 0 %
HCT: 39.5 % (ref 35.0–47.0)
Lymphocyte #: 0.6 10*3/uL — ABNORMAL LOW (ref 1.0–3.6)
Lymphocyte %: 13.9 %
Monocyte #: 0.2 x10 3/mm (ref 0.2–0.9)
RDW: 13.4 % (ref 11.5–14.5)

## 2011-04-24 LAB — TROPONIN I: Troponin-I: <0.02 ng/mL

## 2011-04-24 LAB — BASIC METABOLIC PANEL
Anion Gap: 11 (ref 7–16)
Chloride: 96 mmol/L — ABNORMAL LOW (ref 98–107)
Co2: 25 mmol/L (ref 21–32)
EGFR (Non-African Amer.): >60
Potassium: 4.2 mmol/L (ref 3.5–5.1)
Sodium: 132 mmol/L — ABNORMAL LOW (ref 136–145)

## 2011-04-25 LAB — BASIC METABOLIC PANEL
Anion Gap: 10 (ref 7–16)
BUN: 20 mg/dL — ABNORMAL HIGH (ref 7–18)
Co2: 28 mmol/L (ref 21–32)
EGFR (African American): >60
EGFR (Non-African Amer.): >60
Glucose: 135 mg/dL — ABNORMAL HIGH (ref 65–99)
Potassium: 4.1 mmol/L (ref 3.5–5.1)
Sodium: 133 mmol/L — ABNORMAL LOW (ref 136–145)

## 2011-04-28 ENCOUNTER — Encounter: Payer: Self-pay | Admitting: Internal Medicine

## 2011-05-07 ENCOUNTER — Encounter: Payer: Self-pay | Admitting: Internal Medicine

## 2011-05-07 ENCOUNTER — Ambulatory Visit: Payer: Self-pay | Admitting: Internal Medicine

## 2011-05-12 ENCOUNTER — Telehealth: Payer: Self-pay | Admitting: *Deleted

## 2011-05-12 NOTE — Telephone Encounter (Signed)
Recived form for prior authorization for Prolia. PA Form completed, signed by MD & faxed to [facility] for approval/SLS

## 2011-05-13 NOTE — Telephone Encounter (Signed)
Received approval, LM/sent staff msge for Bianca Gomez [Prolia] as to how to proceed and left contact name & number for [employer]; informed CMAs at [facility] office/SLS

## 2011-05-15 LAB — URINALYSIS, COMPLETE
Bilirubin,UR: NEGATIVE
Ketone: NEGATIVE
Leukocyte Esterase: NEGATIVE
Ph: 7 (ref 4.5–8.0)
Protein: NEGATIVE
Specific Gravity: 1.029 (ref 1.003–1.030)
Squamous Epithelial: <1
WBC UR: <1 /HPF (ref 0–5)

## 2011-05-15 LAB — CBC
MCH: 31.8 pg (ref 26.0–34.0)
Platelet: 143 10*3/uL — ABNORMAL LOW (ref 150–440)
RBC: 3.96 10*6/uL (ref 3.80–5.20)
WBC: 9.3 10*3/uL (ref 3.6–11.0)

## 2011-05-15 LAB — COMPREHENSIVE METABOLIC PANEL
Albumin: 2.9 g/dL — ABNORMAL LOW (ref 3.4–5.0)
Alkaline Phosphatase: 79 U/L (ref 50–136)
Calcium, Total: 8.7 mg/dL (ref 8.5–10.1)
Chloride: 100 mmol/L (ref 98–107)
Creatinine: 0.6 mg/dL (ref 0.60–1.30)
EGFR (African American): >60
EGFR (Non-African Amer.): >60
SGOT(AST): 33 U/L (ref 15–37)
SGPT (ALT): 29 U/L
Sodium: 134 mmol/L — ABNORMAL LOW (ref 136–145)
Total Protein: 6.7 g/dL (ref 6.4–8.2)

## 2011-05-15 LAB — PRO B NATRIURETIC PEPTIDE: B-Type Natriuretic Peptide: 3049 pg/mL — ABNORMAL HIGH (ref 0–450)

## 2011-05-16 LAB — CK-MB
CK-MB: 1.5 ng/mL (ref 0.5–3.6)
CK-MB: 1.2 ng/mL (ref 0.5–3.6)

## 2011-05-16 LAB — BASIC METABOLIC PANEL
BUN: 9 mg/dL (ref 7–18)
Co2: 24 mmol/L (ref 21–32)
Creatinine: 0.58 mg/dL — ABNORMAL LOW (ref 0.60–1.30)
EGFR (Non-African Amer.): >60
Glucose: 97 mg/dL (ref 65–99)
Osmolality: 269 (ref 275–301)
Sodium: 135 mmol/L — ABNORMAL LOW (ref 136–145)

## 2011-05-16 LAB — CBC WITH DIFFERENTIAL/PLATELET
Eosinophil #: 0.2 10*3/uL (ref 0.0–0.7)
HCT: 35.3 % (ref 35.0–47.0)
HGB: 12.2 g/dL (ref 12.0–16.0)
Lymphocyte #: 0.7 10*3/uL — ABNORMAL LOW (ref 1.0–3.6)
MCH: 32 pg (ref 26.0–34.0)
Monocyte #: 0.6 x10 3/mm (ref 0.2–0.9)
Monocyte %: 8.1 %
RBC: 3.81 10*6/uL (ref 3.80–5.20)
RDW: 14 % (ref 11.5–14.5)

## 2011-05-16 LAB — TROPONIN I
Troponin-I: <0.02 ng/mL
Troponin-I: <0.02 ng/mL

## 2011-05-18 ENCOUNTER — Ambulatory Visit: Payer: Self-pay | Admitting: Neurology

## 2011-05-19 ENCOUNTER — Inpatient Hospital Stay: Payer: Self-pay | Admitting: Internal Medicine

## 2011-05-19 LAB — BASIC METABOLIC PANEL
Anion Gap: 8 (ref 7–16)
BUN: 9 mg/dL (ref 7–18)
Calcium, Total: 8.6 mg/dL (ref 8.5–10.1)
Chloride: 96 mmol/L — ABNORMAL LOW (ref 98–107)
Co2: 30 mmol/L (ref 21–32)
Creatinine: 0.6 mg/dL (ref 0.60–1.30)
EGFR (African American): >60
EGFR (Non-African Amer.): >60
Glucose: 111 mg/dL — ABNORMAL HIGH (ref 65–99)

## 2011-05-20 ENCOUNTER — Telehealth: Payer: Self-pay | Admitting: *Deleted

## 2011-05-20 LAB — BASIC METABOLIC PANEL
Calcium, Total: 8.4 mg/dL — ABNORMAL LOW (ref 8.5–10.1)
Chloride: 94 mmol/L — ABNORMAL LOW (ref 98–107)
Creatinine: 0.61 mg/dL (ref 0.60–1.30)
EGFR (Non-African Amer.): >60
Glucose: 102 mg/dL — ABNORMAL HIGH (ref 65–99)
Osmolality: 272 (ref 275–301)
Potassium: 3.7 mmol/L (ref 3.5–5.1)
Sodium: 136 mmol/L (ref 136–145)

## 2011-05-20 LAB — PRO B NATRIURETIC PEPTIDE: B-Type Natriuretic Peptide: 1984 pg/mL — ABNORMAL HIGH (ref 0–450)

## 2011-05-20 NOTE — Telephone Encounter (Signed)
Gave approval information to Bianca Gomez so that patient's Prolia can be ordered to be administered during 05.20.13 CPE visit [costwise]; LMOM to inform patient that we were ordering medication/SLS

## 2011-05-21 LAB — PLATELET COUNT: Platelet: 223 10*3/uL (ref 150–440)

## 2011-05-22 LAB — BASIC METABOLIC PANEL
Anion Gap: 11 (ref 7–16)
BUN: 24 mg/dL — ABNORMAL HIGH (ref 7–18)
Calcium, Total: 9.6 mg/dL (ref 8.5–10.1)
Chloride: 91 mmol/L — ABNORMAL LOW (ref 98–107)
Creatinine: 0.67 mg/dL (ref 0.60–1.30)
EGFR (African American): >60
EGFR (Non-African Amer.): >60
Glucose: 121 mg/dL — ABNORMAL HIGH (ref 65–99)
Potassium: 3.4 mmol/L — ABNORMAL LOW (ref 3.5–5.1)

## 2011-05-22 LAB — CBC WITH DIFFERENTIAL/PLATELET
Basophil %: 0.1 %
Eosinophil #: 0 10*3/uL (ref 0.0–0.7)
HGB: 12.4 g/dL (ref 12.0–16.0)
Lymphocyte #: 0.8 10*3/uL — ABNORMAL LOW (ref 1.0–3.6)
Lymphocyte %: 8.1 %
MCH: 31.3 pg (ref 26.0–34.0)
MCV: 91 fL (ref 80–100)
Monocyte #: 0.8 x10 3/mm (ref 0.2–0.9)
Neutrophil #: 8 10*3/uL — ABNORMAL HIGH (ref 1.4–6.5)
Neutrophil %: 83.2 %
Platelet: 278 10*3/uL (ref 150–440)
RBC: 3.95 10*6/uL (ref 3.80–5.20)

## 2011-05-23 LAB — BASIC METABOLIC PANEL
BUN: 24 mg/dL — ABNORMAL HIGH (ref 7–18)
Calcium, Total: 9.6 mg/dL (ref 8.5–10.1)
Chloride: 92 mmol/L — ABNORMAL LOW (ref 98–107)
Creatinine: 0.67 mg/dL (ref 0.60–1.30)
EGFR (African American): >60
Osmolality: 278 (ref 275–301)
Potassium: 4 mmol/L (ref 3.5–5.1)

## 2011-05-23 LAB — MAGNESIUM: Magnesium: 2 mg/dL

## 2011-05-24 LAB — BASIC METABOLIC PANEL
BUN: 34 mg/dL — ABNORMAL HIGH (ref 7–18)
Calcium, Total: 9.9 mg/dL (ref 8.5–10.1)
Chloride: 89 mmol/L — ABNORMAL LOW (ref 98–107)
Creatinine: 0.68 mg/dL (ref 0.60–1.30)
EGFR (African American): >60
EGFR (Non-African Amer.): >60
Potassium: 3.7 mmol/L (ref 3.5–5.1)
Sodium: 131 mmol/L — ABNORMAL LOW (ref 136–145)

## 2011-05-25 LAB — BASIC METABOLIC PANEL
Anion Gap: 10 (ref 7–16)
BUN: 36 mg/dL — ABNORMAL HIGH (ref 7–18)
Chloride: 90 mmol/L — ABNORMAL LOW (ref 98–107)
Co2: 33 mmol/L — ABNORMAL HIGH (ref 21–32)
Creatinine: 0.65 mg/dL (ref 0.60–1.30)
EGFR (African American): >60
EGFR (Non-African Amer.): >60
Glucose: 131 mg/dL — ABNORMAL HIGH (ref 65–99)
Potassium: 3.8 mmol/L (ref 3.5–5.1)
Sodium: 133 mmol/L — ABNORMAL LOW (ref 136–145)

## 2011-05-25 LAB — OSMOLALITY: Osmolality: 285 mOsm/kg (ref 280–301)

## 2011-05-26 ENCOUNTER — Encounter: Payer: Medicare Other | Admitting: Internal Medicine

## 2011-05-27 LAB — OSMOLALITY, URINE: Osmolality: 487 mOsm/kg

## 2011-05-28 LAB — CBC WITH DIFFERENTIAL/PLATELET
Basophil #: 0 10*3/uL (ref 0.0–0.1)
Basophil %: 0.1 %
Eosinophil #: 0.1 10*3/uL (ref 0.0–0.7)
Eosinophil %: 1.4 %
HCT: 41.7 % (ref 35.0–47.0)
HGB: 13.7 g/dL (ref 12.0–16.0)
Lymphocyte #: 1.1 10*3/uL (ref 1.0–3.6)
Lymphocyte %: 10.8 %
MCHC: 32.7 g/dL (ref 32.0–36.0)
Monocyte #: 0.8 x10 3/mm (ref 0.2–0.9)
Monocyte %: 7.5 %
Neutrophil #: 8 10*3/uL — ABNORMAL HIGH (ref 1.4–6.5)
Platelet: 268 10*3/uL (ref 150–440)
RDW: 14.6 % — ABNORMAL HIGH (ref 11.5–14.5)

## 2011-05-28 LAB — BASIC METABOLIC PANEL
Anion Gap: 9 (ref 7–16)
Anion Gap: 5 — ABNORMAL LOW (ref 7–16)
Calcium, Total: 8.4 mg/dL — ABNORMAL LOW (ref 8.5–10.1)
Chloride: 95 mmol/L — ABNORMAL LOW (ref 98–107)
Co2: 33 mmol/L — ABNORMAL HIGH (ref 21–32)
Co2: 37 mmol/L — ABNORMAL HIGH (ref 21–32)
Creatinine: 0.8 mg/dL (ref 0.60–1.30)
EGFR (African American): >60
EGFR (Non-African Amer.): >60
EGFR (Non-African Amer.): >60
Glucose: 162 mg/dL — ABNORMAL HIGH (ref 65–99)
Glucose: 106 mg/dL — ABNORMAL HIGH (ref 65–99)
Osmolality: 280 (ref 275–301)
Osmolality: 279 (ref 275–301)
Potassium: 3.5 mmol/L (ref 3.5–5.1)
Sodium: 135 mmol/L — ABNORMAL LOW (ref 136–145)
Sodium: 137 mmol/L (ref 136–145)

## 2011-05-28 LAB — APTT: Activated PTT: 25.8 secs (ref 23.6–35.9)

## 2011-05-28 LAB — HEMOGLOBIN A1C: Hemoglobin A1C: 6.1 % (ref 4.2–6.3)

## 2011-05-28 LAB — PROTIME-INR
INR: 1
Prothrombin Time: 13.1 secs (ref 11.5–14.7)

## 2011-05-29 LAB — BASIC METABOLIC PANEL
Anion Gap: 9 (ref 7–16)
Calcium, Total: 9 mg/dL (ref 8.5–10.1)
Chloride: 93 mmol/L — ABNORMAL LOW (ref 98–107)
Creatinine: 0.75 mg/dL (ref 0.60–1.30)
EGFR (African American): >60
EGFR (Non-African Amer.): >60
Glucose: 107 mg/dL — ABNORMAL HIGH (ref 65–99)
Potassium: 3.6 mmol/L (ref 3.5–5.1)

## 2011-05-30 ENCOUNTER — Telehealth: Payer: Self-pay | Admitting: Internal Medicine

## 2011-05-30 NOTE — Telephone Encounter (Signed)
Per verbal, Carollee Herter will make f/u discharge phone call within 48 hr period/SLS

## 2011-05-30 NOTE — Telephone Encounter (Signed)
Patient is being discharged from the hospital today she has an appointment on Tuesday with Dr. Dan Humphreys.

## 2011-06-03 ENCOUNTER — Ambulatory Visit: Payer: Medicare Other | Admitting: Internal Medicine

## 2011-06-03 ENCOUNTER — Telehealth: Payer: Self-pay | Admitting: Internal Medicine

## 2011-06-03 ENCOUNTER — Telehealth: Payer: Self-pay | Admitting: *Deleted

## 2011-06-03 NOTE — Telephone Encounter (Signed)
After being unable to reach Pt at home and/or work number listed, [I called [facility], but they had no different number]; I was able to get a contact name & number via [facility] patient accounting: Bianca Gomez [phone], at which I [facility] with contact name & call back number concerning missed appointment this morning by patient & inability to reach Pt at home/SLS

## 2011-06-03 NOTE — Telephone Encounter (Signed)
Caller: Linda/Emergency Contact; PCP: Ronna Polio; CB#: [phone] regarding Returning Call To Office;

## 2011-06-03 NOTE — Telephone Encounter (Signed)
Attempted to reach patient via home phone, no answer-no ans machine, "finally states mailbox is full" after multiple rings; LMOM [general] at work number given [facility]]; no emergency contact listed in Pt demographics, to f/u on No Show follow-up post hospital visit scheduled today 05.28.13 @ 10:00am/SLS

## 2011-06-03 NOTE — Telephone Encounter (Signed)
Can you make sure Ms. Poznanski is okay?

## 2011-06-04 ENCOUNTER — Telehealth: Payer: Self-pay | Admitting: Internal Medicine

## 2011-06-04 NOTE — Telephone Encounter (Signed)
Error  See note dated 5/28

## 2011-06-04 NOTE — Telephone Encounter (Signed)
Bianca Gomez employer called, Bianca Gomez had left message yesterday trying to locate her.  Bianca Gomez is in [place] [facility] [place] [facility] rehab.   Employer also stated that Bianca Gomez retired a couple of month ago.  She was in hospital and they send her to [facility] for rehab

## 2011-06-07 ENCOUNTER — Ambulatory Visit: Payer: Self-pay | Admitting: Internal Medicine

## 2011-06-11 ENCOUNTER — Ambulatory Visit: Payer: Self-pay | Admitting: Internal Medicine

## 2011-06-19 ENCOUNTER — Encounter: Payer: Self-pay | Admitting: Internal Medicine

## 2011-06-19 LAB — URINALYSIS, COMPLETE
Hyaline Cast: 3
Ketone: NEGATIVE
Leukocyte Esterase: NEGATIVE
Nitrite: NEGATIVE
Ph: 7 (ref 4.5–8.0)
Protein: NEGATIVE
Squamous Epithelial: 1
WBC UR: 10 /HPF (ref 0–5)

## 2011-06-25 ENCOUNTER — Ambulatory Visit: Payer: Self-pay | Admitting: Internal Medicine

## 2011-06-26 LAB — GENTAMICIN LEVEL, PEAK: Gentamicin, Peak: 3.9 ug/mL — ABNORMAL LOW (ref 4.0–8.0)

## 2011-06-26 LAB — GENTAMICIN LEVEL, TROUGH: Gentamicin, Trough: 0.7 ug/mL (ref 0.0–2.0)

## 2011-06-30 LAB — BASIC METABOLIC PANEL
Anion Gap: 7 (ref 7–16)
BUN: 11 mg/dL (ref 7–18)
Calcium, Total: 8.3 mg/dL — ABNORMAL LOW (ref 8.5–10.1)
Co2: 32 mmol/L (ref 21–32)
EGFR (African American): >60
EGFR (Non-African Amer.): >60
Glucose: 76 mg/dL (ref 65–99)
Sodium: 137 mmol/L (ref 136–145)

## 2011-07-02 LAB — URINALYSIS, COMPLETE
Bilirubin,UR: NEGATIVE
Glucose,UR: NEGATIVE mg/dL (ref 0–75)
Hyaline Cast: 21
Ketone: NEGATIVE
Leukocyte Esterase: NEGATIVE
Nitrite: NEGATIVE
Ph: 7 (ref 4.5–8.0)
Protein: NEGATIVE
Specific Gravity: 1.005 (ref 1.003–1.030)
Squamous Epithelial: <1
WBC UR: <1 /HPF (ref 0–5)

## 2011-07-07 ENCOUNTER — Encounter: Payer: Self-pay | Admitting: Internal Medicine

## 2011-07-18 ENCOUNTER — Ambulatory Visit: Payer: Self-pay | Admitting: Specialist

## 2011-08-07 ENCOUNTER — Encounter: Payer: Self-pay | Admitting: Internal Medicine

## 2011-08-19 ENCOUNTER — Other Ambulatory Visit: Payer: Self-pay | Admitting: Internal Medicine

## 2011-08-22 ENCOUNTER — Telehealth: Payer: Self-pay | Admitting: Internal Medicine

## 2011-08-22 NOTE — Telephone Encounter (Signed)
This is not on patient's med list, is it okay to fill?

## 2011-08-22 NOTE — Telephone Encounter (Signed)
Refill on her potassium 25 mcq

## 2011-08-23 NOTE — Telephone Encounter (Signed)
Can you ask her if this is a new medication

## 2011-08-25 NOTE — Telephone Encounter (Signed)
I think patient is confused, she said she has been taking losartan potassium, but was told by the nursing home that she needs just potassium. Do you want me to bring her in to discuss meds?

## 2011-08-25 NOTE — Telephone Encounter (Signed)
Need to discuss first. Who started this?

## 2011-08-25 NOTE — Telephone Encounter (Signed)
Yes, please.

## 2011-08-25 NOTE — Telephone Encounter (Signed)
Patient notified. She has appt scheduled for 09-05-11 ans she says that she wants to keep that.

## 2011-08-25 NOTE — Telephone Encounter (Signed)
Patient says that this is a new medication.

## 2011-08-28 LAB — URINE CULTURE

## 2011-09-03 DIAGNOSIS — I509 Heart failure, unspecified: Secondary | ICD-10-CM

## 2011-09-03 DIAGNOSIS — I5033 Acute on chronic diastolic (congestive) heart failure: Secondary | ICD-10-CM

## 2011-09-03 DIAGNOSIS — I10 Essential (primary) hypertension: Secondary | ICD-10-CM

## 2011-09-05 ENCOUNTER — Encounter: Payer: Self-pay | Admitting: Internal Medicine

## 2011-09-05 ENCOUNTER — Other Ambulatory Visit: Payer: Self-pay | Admitting: *Deleted

## 2011-09-05 ENCOUNTER — Ambulatory Visit (INDEPENDENT_AMBULATORY_CARE_PROVIDER_SITE_OTHER): Payer: Medicare Other | Admitting: Internal Medicine

## 2011-09-05 VITALS — BP 118/82 | HR 91 | Temp 98.7°F | Ht 60.0 in | Wt 183.8 lb

## 2011-09-05 DIAGNOSIS — E2749 Other adrenocortical insufficiency: Secondary | ICD-10-CM

## 2011-09-05 DIAGNOSIS — R001 Bradycardia, unspecified: Secondary | ICD-10-CM

## 2011-09-05 DIAGNOSIS — I498 Other specified cardiac arrhythmias: Secondary | ICD-10-CM

## 2011-09-05 DIAGNOSIS — E2740 Unspecified adrenocortical insufficiency: Secondary | ICD-10-CM

## 2011-09-05 DIAGNOSIS — J8410 Pulmonary fibrosis, unspecified: Secondary | ICD-10-CM

## 2011-09-05 LAB — COMPREHENSIVE METABOLIC PANEL
ALT: 16 U/L (ref 0–35)
Albumin: 4 g/dL (ref 3.5–5.2)
Alkaline Phosphatase: 60 U/L (ref 39–117)
CO2: 29 mEq/L (ref 19–32)
GFR: 92.81 mL/min (ref >60.00)
Glucose, Bld: 120 mg/dL — ABNORMAL HIGH (ref 70–99)
Potassium: 3.5 mEq/L (ref 3.5–5.1)
Sodium: 139 mEq/L (ref 135–145)
Total Bilirubin: 0.3 mg/dL (ref 0.3–1.2)
Total Protein: 7.3 g/dL (ref 6.0–8.3)

## 2011-09-05 LAB — CBC WITH DIFFERENTIAL/PLATELET
Basophils Absolute: 0 10*3/uL (ref 0.0–0.1)
Eosinophils Absolute: 0 10*3/uL (ref 0.0–0.7)
HCT: 37.7 % (ref 36.0–46.0)
Hemoglobin: 12.6 g/dL (ref 12.0–15.0)
Lymphs Abs: 0.9 10*3/uL (ref 0.7–4.0)
MCHC: 33.3 g/dL (ref 30.0–36.0)
Monocytes Absolute: 0.4 10*3/uL (ref 0.1–1.0)
Monocytes Relative: 4.7 % (ref 3.0–12.0)
Neutro Abs: 7 10*3/uL (ref 1.4–7.7)
Platelets: 163 10*3/uL (ref 150.0–400.0)
RDW: 15.4 % — ABNORMAL HIGH (ref 11.5–14.6)

## 2011-09-05 LAB — MAGNESIUM: Magnesium: 1.9 mg/dL (ref 1.5–2.5)

## 2011-09-05 MED ORDER — BUDESONIDE-FORMOTEROL FUMARATE 160-4.5 MCG/ACT IN AERO: 2.0000 | INHALATION_SPRAY | Freq: Two times a day (BID) | RESPIRATORY_TRACT | Status: DC

## 2011-09-05 MED ORDER — MUCINEX DM 30-600 MG PO TB12: 1.0000 | ORAL_TABLET | Freq: Two times a day (BID) | ORAL | Status: DC

## 2011-09-05 MED ORDER — FLUDROCORTISONE ACETATE 0.1 MG PO TABS: 0.1000 mg | ORAL_TABLET | Freq: Every day | ORAL | Status: DC

## 2011-09-05 MED ORDER — ZOLPIDEM TARTRATE 5 MG PO TABS: 5.0000 mg | ORAL_TABLET | Freq: Every evening | ORAL | Status: DC | PRN

## 2011-09-05 MED ORDER — PREDNISONE 10 MG PO TABS: 10.0000 mg | ORAL_TABLET | Freq: Every day | ORAL | Status: DC

## 2011-09-05 MED ORDER — TORSEMIDE 10 MG PO TABS: 10.0000 mg | ORAL_TABLET | Freq: Every day | ORAL | Status: DC

## 2011-09-05 MED ORDER — TIOTROPIUM BROMIDE MONOHYDRATE 18 MCG IN CAPS: 18.0000 ug | ORAL_CAPSULE | Freq: Every day | RESPIRATORY_TRACT | Status: DC

## 2011-09-05 MED ORDER — MIDODRINE HCL 10 MG PO TABS: 10.0000 mg | ORAL_TABLET | Freq: Three times a day (TID) | ORAL | Status: DC

## 2011-09-05 NOTE — Telephone Encounter (Signed)
Rx called to pharmacy. Bianca Gomez has been notified.

## 2011-09-05 NOTE — Telephone Encounter (Signed)
We can call in Ambien 5mg  po qhs, disp 30 no refills. Will need follow up.

## 2011-09-05 NOTE — Telephone Encounter (Signed)
Jasmine December called stating that patient has been having trouble sleeping and she wanted to know if patient could get a Rx for Ambien.  She has been on Ambien before.  Please advise.

## 2011-09-06 DIAGNOSIS — R001 Bradycardia, unspecified: Secondary | ICD-10-CM | POA: Insufficient documentation

## 2011-09-06 DIAGNOSIS — E2740 Unspecified adrenocortical insufficiency: Secondary | ICD-10-CM | POA: Insufficient documentation

## 2011-09-06 DIAGNOSIS — J8410 Pulmonary fibrosis, unspecified: Secondary | ICD-10-CM | POA: Insufficient documentation

## 2011-09-06 NOTE — Assessment & Plan Note (Signed)
Pt recently hospitalized for acute bronchitis 04/2011, ultimately required re-admission for dyspnea and diagnosed with pulmonary fibrosis. Now on chronic supplemental oxygen. Doing well. Functional capacity improving. Will request records from pt pulmonologist. Follow up 2-4 weeks.

## 2011-09-06 NOTE — Assessment & Plan Note (Signed)
Pt diagnosed with symptomatic bradycardia s/p placement of pacemaker. Doing well. Asymptomatic. Will continue to monitor.

## 2011-09-06 NOTE — Assessment & Plan Note (Signed)
During recent hospitalization, pt developed adrenal insufficiency, now on supplemental steroid. Will request notes from hospitalization. Follow up 2-4 weeks.

## 2011-09-06 NOTE — Progress Notes (Signed)
Subjective:    Patient ID: Ferne Coe, female    DOB: April 24, 1927, 78 y.o.   MRN: [id number]  HPI 78YO female with recent prolonged hospitalization from 04/2011 through 08/2011, initially for acute bronchitis, then ultimately developing pulmonary fibrosis, adrenal insufficiency, and symptomatic bradycardia, presents for follow up. She is now at home, and has been followed by home health. She is receiving PT, OT and some assistance with ADLs.  She reports she is doing well.  She continues to have some shortness of breath, and is wearing supplemental oxygen 2L [place] 24hr per day.  She is working on increasing her ability to walk prolonged distances with PT.  She denies fever, chills, chest pain. Appetite is normal.   Outpatient Encounter Prescriptions as of 09/05/2011  Medication Sig Dispense Refill  . aspirin 81 MG tablet Take 81 mg by mouth daily.        . budesonide-formoterol (SYMBICORT) 160-4.5 MCG/ACT inhaler Inhale 2 puffs into the lungs 2 (two) times daily.  1 Inhaler  3  . Calcium Carbonate-Vitamin D (CALCIUM 600-D) 600-400 MG-UNIT per tablet Take 2 tablets by mouth daily.        . Cholecalciferol (VITAMIN D) 2000 UNITS CAPS Take 1 capsule by mouth daily.        Marland Kitchen Dextromethorphan-Guaifenesin (MUCINEX DM) 30-600 MG TB12 Take 1 tablet by mouth 2 (two) times daily.  28 each  0  . fludrocortisone (FLORINEF) 0.1 MG tablet Take 1 tablet (0.1 mg total) by mouth daily.  30 tablet  6  . fluticasone (FLONASE) 50 MCG/ACT nasal spray Place 2 sprays into the nose daily.        . folic acid (FOLVITE) 400 MCG tablet Take 400 mcg by mouth daily.        Marland Kitchen letrozole (FEMARA) 2.5 MG tablet Take 2.5 mg by mouth daily.        Marland Kitchen levothyroxine (SYNTHROID, LEVOTHROID) 125 MCG tablet TAKE 1 TABLET BY MOUTH EVERY DAY  30 tablet  5  . losartan (COZAAR) 100 MG tablet Take 1 tablet (100 mg total) by mouth daily.  30 tablet  6  . midodrine (PROAMATINE) 10 MG tablet Take 1 tablet (10 mg total) by mouth 3 (three) times  daily.  90 tablet  3  . Multiple Vitamin (MULTIVITAMIN) capsule Take 1 capsule by mouth daily.        . Oxcarbazepine (TRILEPTAL) 300 MG tablet 1 tablet in the AM and 2 in the evening  270 tablet  1  . predniSONE (DELTASONE) 10 MG tablet Take 1 tablet (10 mg total) by mouth daily.  30 tablet  0  . sertraline (ZOLOFT) 100 MG tablet Take 1 tablet (100 mg total) by mouth daily. Takes 2 at bedtime  90 tablet  1  . tiotropium (SPIRIVA HANDIHALER) 18 MCG inhalation capsule Place 1 capsule (18 mcg total) into inhaler and inhale daily.  30 capsule  12  . torsemide (DEMADEX) 10 MG tablet Take 1 tablet (10 mg total) by mouth daily.  30 tablet  3  . vitamin C (ASCORBIC ACID) 500 MG tablet Take 500 mg by mouth daily.         BP 118/82  Pulse 91  Temp 98.7 F (37.1 C) (Oral)  Ht 5' (1.524 m)  Wt 183 lb 12 oz (83.348 kg)  BMI 35.89 kg/m2  SpO2 98%  Review of Systems  Constitutional: Positive for fatigue. Negative for fever, chills, appetite change and unexpected weight change.  HENT: Negative for ear pain,  congestion, sore throat, trouble swallowing, neck pain, voice change and sinus pressure.   Eyes: Negative for visual disturbance.  Respiratory: Positive for shortness of breath. Negative for cough, wheezing and stridor.   Cardiovascular: Negative for chest pain, palpitations and leg swelling.  Gastrointestinal: Negative for nausea, vomiting, abdominal pain, diarrhea, constipation, blood in stool, abdominal distention and anal bleeding.  Genitourinary: Negative for dysuria and flank pain.  Musculoskeletal: Negative for myalgias, arthralgias and gait problem.  Skin: Negative for color change and rash.  Neurological: Negative for dizziness and headaches.  Hematological: Negative for adenopathy. Does not bruise/bleed easily.  Psychiatric/Behavioral: Negative for suicidal ideas, disturbed wake/sleep cycle and dysphoric mood. The patient is not nervous/anxious.        Objective:   Physical Exam    Constitutional: She is oriented to person, place, and time. She appears well-developed and well-nourished. No distress.  HENT:  Head: Normocephalic and atraumatic.  Right Ear: External ear normal.  Left Ear: External ear normal.  Nose: Nose normal.  Mouth/Throat: Oropharynx is clear and moist. No oropharyngeal exudate.  Eyes: Conjunctivae are normal. Pupils are equal, round, and reactive to light. Right eye exhibits no discharge. Left eye exhibits no discharge. No scleral icterus.  Neck: Normal range of motion. Neck supple. No tracheal deviation present. No thyromegaly present.  Cardiovascular: Normal rate, regular rhythm, normal heart sounds and intact distal pulses.  Exam reveals no gallop and no friction rub.   No murmur heard. Pulmonary/Chest: Effort normal. No accessory muscle usage. Not tachypneic. No respiratory distress. She has no decreased breath sounds. She has no wheezes. She has no rhonchi. She has rales (scattered). She exhibits no tenderness.  Musculoskeletal: Normal range of motion. She exhibits no edema and no tenderness.  Lymphadenopathy:    She has no cervical adenopathy.  Neurological: She is alert and oriented to person, place, and time. No cranial nerve deficit. She exhibits normal muscle tone. Coordination normal.  Skin: Skin is warm and dry. No rash noted. She is not diaphoretic. No erythema. No pallor.  Psychiatric: She has a normal mood and affect. Her behavior is normal. Judgment and thought content normal.          Assessment & Plan:

## 2011-09-12 ENCOUNTER — Telehealth: Payer: Self-pay | Admitting: Internal Medicine

## 2011-09-12 NOTE — Telephone Encounter (Signed)
Needing a call about patient's medication.

## 2011-09-15 NOTE — Telephone Encounter (Signed)
Bianca Gomez advised as instructed via telephone.

## 2011-09-15 NOTE — Telephone Encounter (Signed)
Spoke with Jasmine December and she stated that patient's BP has been running 138/88 and 140/90 Thursday and Friday.  She wanted to know if Dr. Dan Humphreys wants patient to continue current medications or can she be taken off any of the BP medication.  Patient is doing fine otherwise, she is weaning herself off the oxygen.  Please advise.

## 2011-09-15 NOTE — Telephone Encounter (Signed)
I think that she should continue current medications.

## 2011-09-18 ENCOUNTER — Telehealth: Payer: Self-pay | Admitting: *Deleted

## 2011-09-18 NOTE — Telephone Encounter (Signed)
We need to keep a close eye on her blood pressure. She was taken off her blood pressure medications in the hospital and it seems that blood pressure is trending up. Please asked nurse to continue to call us periodically with update on blood pressure readings. Preferably one to 2 times per week.

## 2011-09-18 NOTE — Telephone Encounter (Signed)
Bianca Gomez called to let Dr. Dan Humphreys know that she checked patients BP today 158/98 in right arm, 138/96 in left arm.  Heart rate between 50-60.  Patient has no symptoms and Bianca Gomez discussed signs and symptoms of a stroke and told patient to call 9-1-1 if these symptoms develop.

## 2011-09-19 NOTE — Telephone Encounter (Signed)
Left message on cell phone voicemail advising Jasmine December as instructed.

## 2011-09-22 ENCOUNTER — Ambulatory Visit (INDEPENDENT_AMBULATORY_CARE_PROVIDER_SITE_OTHER): Payer: Medicare Other | Admitting: Internal Medicine

## 2011-09-22 ENCOUNTER — Encounter: Payer: Self-pay | Admitting: Internal Medicine

## 2011-09-22 VITALS — BP 136/84 | HR 85 | Temp 98.9°F | Ht 60.0 in | Wt 178.0 lb

## 2011-09-22 DIAGNOSIS — E2740 Unspecified adrenocortical insufficiency: Secondary | ICD-10-CM

## 2011-09-22 DIAGNOSIS — E2749 Other adrenocortical insufficiency: Secondary | ICD-10-CM

## 2011-09-22 DIAGNOSIS — I10 Essential (primary) hypertension: Secondary | ICD-10-CM

## 2011-09-22 DIAGNOSIS — Z23 Encounter for immunization: Secondary | ICD-10-CM

## 2011-09-22 MED ORDER — FLUDROCORTISONE ACETATE 0.1 MG PO TABS: 0.0500 mg | ORAL_TABLET | Freq: Every day | ORAL | Status: DC

## 2011-09-22 MED ORDER — HYDRALAZINE HCL 25 MG PO TABS: 25.0000 mg | ORAL_TABLET | Freq: Three times a day (TID) | ORAL | Status: DC | PRN

## 2011-09-22 NOTE — Assessment & Plan Note (Signed)
Having some transient hypertension on current medications. Will taper her Florinef to 0.05 mg daily. Patient will monitor blood pressure and e-mail with readings. Followup one month.

## 2011-09-22 NOTE — Assessment & Plan Note (Signed)
Having some transient hypertension. Will decrease dose of Florinef as above. Will add hydralazine 25 mg as needed for blood pressure greater than 160/100. Followup one month.

## 2011-09-22 NOTE — Progress Notes (Signed)
Subjective:    Patient ID: Bianca Gomez, female    DOB: [birth year], 78 y.o.   MRN: [id number]  HPI 78 year old female with history of pulmonary fibrosis, adrenal insufficiency, obesity presents for followup. Over the last month, she reports blood pressures have been very labile. She brings record showing that blood pressure has varied from 100s over 60s to 170s/100s.  She is no longer taking blood pressure medication because of hypotension when she was hospitalized. She has been taking prednisone, but this was recently tapered down by her pulmonologist. She continues on Florinef. She denies any chest pain, shortness of breath apart from her baseline. She denies palpitations. She is otherwise feeling well.  Outpatient Encounter Prescriptions as of 09/22/2011  Medication Sig Dispense Refill  . aspirin 81 MG tablet Take 81 mg by mouth daily.        . budesonide-formoterol (SYMBICORT) 160-4.5 MCG/ACT inhaler Inhale 2 puffs into the lungs 2 (two) times daily.  1 Inhaler  3  . Calcium Carbonate-Vitamin D (CALCIUM 600-D) 600-400 MG-UNIT per tablet Take 2 tablets by mouth daily.        . Cholecalciferol (VITAMIN D) 2000 UNITS CAPS Take 1 capsule by mouth daily.        Marland Kitchen Dextromethorphan-Guaifenesin (MUCINEX DM) 30-600 MG TB12 Take 1 tablet by mouth 2 (two) times daily.  28 each  0  . fludrocortisone (FLORINEF) 0.1 MG tablet Take 0.5 tablets (0.05 mg total) by mouth daily.  30 tablet  6  . fluticasone (FLONASE) 50 MCG/ACT nasal spray Place 2 sprays into the nose daily.        . folic acid (FOLVITE) 400 MCG tablet Take 400 mcg by mouth daily.        Marland Kitchen letrozole (FEMARA) 2.5 MG tablet Take 2.5 mg by mouth daily.        Marland Kitchen levothyroxine (SYNTHROID, LEVOTHROID) 125 MCG tablet TAKE 1 TABLET BY MOUTH EVERY DAY  30 tablet  5  . Multiple Vitamin (MULTIVITAMIN) capsule Take 1 capsule by mouth daily.        . Oxcarbazepine (TRILEPTAL) 300 MG tablet 1 tablet in the AM and 2 in the evening  270 tablet  1  .  sertraline (ZOLOFT) 100 MG tablet Take 1 tablet (100 mg total) by mouth daily. Takes 2 at bedtime  90 tablet  1  . tiotropium (SPIRIVA HANDIHALER) 18 MCG inhalation capsule Place 1 capsule (18 mcg total) into inhaler and inhale daily.  30 capsule  12  . vitamin C (ASCORBIC ACID) 500 MG tablet Take 500 mg by mouth daily.        Marland Kitchen zolpidem (AMBIEN) 5 MG tablet Take 1 tablet (5 mg total) by mouth at bedtime as needed for sleep.  30 tablet  0  . DISCONTD: fludrocortisone (FLORINEF) 0.1 MG tablet Take 1 tablet (0.1 mg total) by mouth daily.  30 tablet  6  . hydrALAZINE (APRESOLINE) 25 MG tablet Take 1 tablet (25 mg total) by mouth 3 (three) times daily as needed (For BP consistently greater than 160/100).  90 tablet  3  . losartan (COZAAR) 100 MG tablet Take 1 tablet (100 mg total) by mouth daily.  30 tablet  6  . torsemide (DEMADEX) 10 MG tablet Take 1 tablet (10 mg total) by mouth daily.  30 tablet  3  . DISCONTD: midodrine (PROAMATINE) 10 MG tablet Take 1 tablet (10 mg total) by mouth 3 (three) times daily.  90 tablet  3   BP 136/84  Pulse  85  Temp 98.9 F (37.2 C) (Oral)  Ht 5' (1.524 m)  Wt 178 lb (80.74 kg)  BMI 34.76 kg/m2  SpO2 98%  Review of Systems  Constitutional: Negative for fever, chills, appetite change, fatigue and unexpected weight change.  HENT: Negative for ear pain, congestion, sore throat, trouble swallowing, neck pain, voice change and sinus pressure.   Eyes: Negative for visual disturbance.  Respiratory: Negative for cough, shortness of breath, wheezing and stridor.   Cardiovascular: Negative for chest pain, palpitations and leg swelling.  Gastrointestinal: Negative for nausea, vomiting, abdominal pain, diarrhea, constipation, blood in stool, abdominal distention and anal bleeding.  Genitourinary: Negative for dysuria and flank pain.  Musculoskeletal: Negative for myalgias, arthralgias and gait problem.  Skin: Negative for color change and rash.  Neurological: Negative  for dizziness and headaches.  Hematological: Negative for adenopathy. Does not bruise/bleed easily.  Psychiatric/Behavioral: Negative for suicidal ideas, disturbed wake/sleep cycle and dysphoric mood. The patient is not nervous/anxious.        Objective:   Physical Exam  Constitutional: She is oriented to person, place, and time. She appears well-developed and well-nourished. No distress.  HENT:  Head: Normocephalic and atraumatic.  Right Ear: External ear normal.  Left Ear: External ear normal.  Nose: Nose normal.  Mouth/Throat: Oropharynx is clear and moist. No oropharyngeal exudate.  Eyes: Conjunctivae normal are normal. Pupils are equal, round, and reactive to light. Right eye exhibits no discharge. Left eye exhibits no discharge. No scleral icterus.  Neck: Normal range of motion. Neck supple. No tracheal deviation present. No thyromegaly present.  Cardiovascular: Normal rate, regular rhythm, normal heart sounds and intact distal pulses.  Exam reveals no gallop and no friction rub.   No murmur heard. Pulmonary/Chest: Effort normal and breath sounds normal. No respiratory distress. She has no wheezes. She has no rales. She exhibits no tenderness.  Musculoskeletal: Normal range of motion. She exhibits no edema and no tenderness.  Lymphadenopathy:    She has no cervical adenopathy.  Neurological: She is alert and oriented to person, place, and time. No cranial nerve deficit. She exhibits normal muscle tone. Coordination normal.  Skin: Skin is warm and dry. No rash noted. She is not diaphoretic. No erythema. No pallor.  Psychiatric: She has a normal mood and affect. Her behavior is normal. Judgment and thought content normal.          Assessment & Plan:

## 2011-09-22 NOTE — Patient Instructions (Signed)
Decrease Florinef to 0.05mg  daily (half tablet).  Monitor blood pressure daily. Call if blood pressure greater than 160/100.  If BP<160/100, take hydralazine 25mg  and repeat BP in 1 hour.

## 2011-09-24 ENCOUNTER — Telehealth: Payer: Self-pay | Admitting: *Deleted

## 2011-09-24 NOTE — Telephone Encounter (Signed)
Jasmine December called to report that patients BP has been very good.  Left arm 124/70 Right arm 130/70.  She has instructed patient to check BP twice daily and keep a record to bring in the next time she sees Dr. Dan Humphreys.

## 2011-10-13 ENCOUNTER — Other Ambulatory Visit: Payer: Self-pay | Admitting: Internal Medicine

## 2011-10-13 ENCOUNTER — Encounter: Payer: Self-pay | Admitting: Internal Medicine

## 2011-10-13 NOTE — Telephone Encounter (Signed)
Pt says Pharmacy faxed over refill request for oxcarbazepine but she is concerned because it has been a while since she has taken the meds (since Friday night) and it may cause Seizures if meds are not taken. [facility] uses [facility] [address]

## 2011-10-15 ENCOUNTER — Encounter: Payer: Self-pay | Admitting: Internal Medicine

## 2011-10-23 ENCOUNTER — Ambulatory Visit: Payer: Self-pay | Admitting: Oncology

## 2011-10-23 ENCOUNTER — Ambulatory Visit (INDEPENDENT_AMBULATORY_CARE_PROVIDER_SITE_OTHER): Payer: Medicare Other | Admitting: Internal Medicine

## 2011-10-23 ENCOUNTER — Telehealth: Payer: Self-pay | Admitting: Internal Medicine

## 2011-10-23 ENCOUNTER — Encounter: Payer: Self-pay | Admitting: Internal Medicine

## 2011-10-23 VITALS — BP 110/88 | HR 76 | Temp 98.2°F | Ht 60.0 in | Wt 179.2 lb

## 2011-10-23 DIAGNOSIS — E2740 Unspecified adrenocortical insufficiency: Secondary | ICD-10-CM

## 2011-10-23 DIAGNOSIS — J8410 Pulmonary fibrosis, unspecified: Secondary | ICD-10-CM

## 2011-10-23 DIAGNOSIS — E2749 Other adrenocortical insufficiency: Secondary | ICD-10-CM

## 2011-10-23 NOTE — Telephone Encounter (Signed)
Spoke with Dr. Lady Gary, pt should stop Florinef.

## 2011-10-23 NOTE — Assessment & Plan Note (Signed)
Patient continues to have some intermittent hypertension with Florinef 0.05 mg daily, however in general BP 110-120 systolic. Notes sent to a cardiologist today. Would like to stop Florinef. Will await response from cardiologist and make decision about stopping medication. Follow up here in 3 months.

## 2011-10-23 NOTE — Progress Notes (Signed)
Subjective:    Patient ID: Bianca Gomez, female    DOB: 1927/12/05, 78 y.o.   MRN: [id number]  HPI 78 year old female with history of bradycardia status post pacemaker placement, adrenal insufficiency, pulmonary fibrosis presents for followup. She reports in the interim since her last visit she has been doing well. She reports that her eating has improved. She continues to have some dyspnea with minimal exertion. She continues to wear oxygen round-the-clock. She denies any chest pain or palpitations. She has been monitoring her blood pressure and in general it has been well controlled with systolic blood pressure near 110 or 120. However, she has had a couple of readings where her diastolic blood pressure was greater than 100. These occur during physical therapy sessions. She notes that she did taper Florinef to 0.05 mg daily. She has no new concerns today.  Outpatient Encounter Prescriptions as of 10/23/2011  Medication Sig Dispense Refill  . aspirin 81 MG tablet Take 81 mg by mouth daily.        . budesonide-formoterol (SYMBICORT) 160-4.5 MCG/ACT inhaler Inhale 2 puffs into the lungs 2 (two) times daily.  1 Inhaler  3  . Calcium Carbonate-Vitamin D (CALCIUM 600-D) 600-400 MG-UNIT per tablet Take 2 tablets by mouth daily.        . Cholecalciferol (VITAMIN D) 2000 UNITS CAPS Take 1 capsule by mouth daily.        Marland Kitchen Dextromethorphan-Guaifenesin (MUCINEX DM) 30-600 MG TB12 Take 1 tablet by mouth 2 (two) times daily.  28 each  0  . fludrocortisone (FLORINEF) 0.1 MG tablet Take 0.5 tablets (0.05 mg total) by mouth daily.  30 tablet  6  . fluticasone (FLONASE) 50 MCG/ACT nasal spray Place 2 sprays into the nose daily.        . folic acid (FOLVITE) 400 MCG tablet Take 400 mcg by mouth daily.        . hydrALAZINE (APRESOLINE) 25 MG tablet Take 1 tablet (25 mg total) by mouth 3 (three) times daily as needed (For BP consistently greater than 160/100).  90 tablet  3  . letrozole (FEMARA) 2.5 MG tablet Take  2.5 mg by mouth daily.        Marland Kitchen levothyroxine (SYNTHROID, LEVOTHROID) 125 MCG tablet TAKE 1 TABLET BY MOUTH EVERY DAY  30 tablet  5  . Multiple Vitamin (MULTIVITAMIN) capsule Take 1 capsule by mouth daily.        . Oxcarbazepine (TRILEPTAL) 300 MG tablet TAKE 1 TABLET BY MOUTH IN THE MORNING AND TAKE 2 TABLETS BY MOUTH AT BEDTIME  90 tablet  3  . sertraline (ZOLOFT) 100 MG tablet TAKE 2 TABLETS BY MOUTH AT BEDTIME  60 tablet  3  . tiotropium (SPIRIVA HANDIHALER) 18 MCG inhalation capsule Place 1 capsule (18 mcg total) into inhaler and inhale daily.  30 capsule  12  . torsemide (DEMADEX) 10 MG tablet Take 1 tablet (10 mg total) by mouth daily.  30 tablet  3  . vitamin C (ASCORBIC ACID) 500 MG tablet Take 500 mg by mouth daily.        Marland Kitchen zolpidem (AMBIEN) 5 MG tablet Take 1 tablet (5 mg total) by mouth at bedtime as needed for sleep.  30 tablet  0  . DISCONTD: losartan (COZAAR) 100 MG tablet Take 1 tablet (100 mg total) by mouth daily.  30 tablet  6  . DISCONTD: losartan (COZAAR) 100 MG tablet Take 100 mg by mouth daily.       78 y.o. 76 y.o. BP 110/88  Pulse 76  Temp 98.2 F (36.8 C) (Oral)  Ht 5' (1.524 m)  Wt 179 lb 4 oz (81.307 kg)  BMI 35.01 kg/m2  SpO2 97%  Review of Systems  Constitutional: Negative for fever, chills, appetite change, fatigue and unexpected weight change.  HENT: Negative for ear pain, congestion, sore throat, trouble swallowing, neck pain, voice change and sinus pressure.   Eyes: Negative for visual disturbance.  Respiratory: Positive for shortness of breath (on minimal exertion). Negative for cough, wheezing and stridor.   Cardiovascular: Negative for chest pain, palpitations and leg swelling.  Gastrointestinal: Negative for nausea, vomiting, abdominal pain, diarrhea, constipation, blood in stool, abdominal distention and anal bleeding.  Genitourinary: Negative for dysuria and flank pain.  Musculoskeletal: Negative for myalgias, arthralgias and gait problem.  Skin: Negative for  color change and rash.  Neurological: Negative for dizziness and headaches.  Hematological: Negative for adenopathy. Does not bruise/bleed easily.  Psychiatric/Behavioral: Negative for suicidal ideas, disturbed wake/sleep cycle and dysphoric mood. The patient is not nervous/anxious.        Objective:   Physical Exam  Constitutional: She is oriented to person, place, and time. She appears well-developed and well-nourished. No distress.  HENT:  Head: Normocephalic and atraumatic.  Right Ear: External ear normal.  Left Ear: External ear normal.  Nose: Nose normal.  Mouth/Throat: Oropharynx is clear and moist. No oropharyngeal exudate.  Eyes: Conjunctivae normal are normal. Pupils are equal, round, and reactive to light. Right eye exhibits no discharge. Left eye exhibits no discharge. No scleral icterus.  Neck: Normal range of motion. Neck supple. No tracheal deviation present. No thyromegaly present.  Cardiovascular: Normal rate, regular rhythm, normal heart sounds and intact distal pulses.  Exam reveals no gallop and no friction rub.   No murmur heard. Pulmonary/Chest: Effort normal and breath sounds normal. No respiratory distress. She has no wheezes. She has no rales. She exhibits no tenderness.  Musculoskeletal: Normal range of motion. She exhibits no edema and no tenderness.  Lymphadenopathy:    She has no cervical adenopathy.  Neurological: She is alert and oriented to person, place, and time. No cranial nerve deficit. She exhibits normal muscle tone. Coordination normal.  Skin: Skin is warm and dry. No rash noted. She is not diaphoretic. No erythema. No pallor.  Psychiatric: She has a normal mood and affect. Her behavior is normal. Judgment and thought content normal.          Assessment & Plan:

## 2011-10-23 NOTE — Assessment & Plan Note (Signed)
Patient reports significant improvement in breathing since last visit. Continues to have dyspnea with exertion. Has followup with pulmonary medicine next week. Will not make any changes in medications today. Followup here 3 months.

## 2011-10-24 NOTE — Telephone Encounter (Signed)
Left message on home phone for pt to return call.

## 2011-10-27 NOTE — Telephone Encounter (Signed)
Patient advised via telephone

## 2011-10-28 ENCOUNTER — Ambulatory Visit: Payer: Self-pay | Admitting: Oncology

## 2011-11-07 ENCOUNTER — Ambulatory Visit: Payer: Self-pay | Admitting: Oncology

## 2011-12-05 ENCOUNTER — Other Ambulatory Visit: Payer: Self-pay | Admitting: Internal Medicine

## 2011-12-05 DIAGNOSIS — J8410 Pulmonary fibrosis, unspecified: Secondary | ICD-10-CM

## 2011-12-05 NOTE — Telephone Encounter (Signed)
Ambien refill request. Med last filled on 8/30 qty 30 0 refills.Pt last seen on 10/17. Ok to refill?

## 2011-12-16 MED ORDER — ZOLPIDEM TARTRATE 5 MG PO TABS: ORAL_TABLET | ORAL | Status: DC

## 2011-12-16 MED ORDER — MUCINEX DM 30-600 MG PO TB12: 1.0000 | ORAL_TABLET | Freq: Two times a day (BID) | ORAL | Status: DC

## 2011-12-16 NOTE — Telephone Encounter (Signed)
Meds filled per Dr. Dan Humphreys.

## 2012-01-07 DIAGNOSIS — I639 Cerebral infarction, unspecified: Secondary | ICD-10-CM

## 2012-01-07 HISTORY — DX: Cerebral infarction, unspecified: I63.9

## 2012-01-08 ENCOUNTER — Other Ambulatory Visit: Payer: Self-pay | Admitting: Internal Medicine

## 2012-01-18 ENCOUNTER — Other Ambulatory Visit: Payer: Self-pay | Admitting: Internal Medicine

## 2012-01-27 ENCOUNTER — Ambulatory Visit (INDEPENDENT_AMBULATORY_CARE_PROVIDER_SITE_OTHER): Payer: Medicare Other | Admitting: Internal Medicine

## 2012-01-27 ENCOUNTER — Encounter: Payer: Self-pay | Admitting: Internal Medicine

## 2012-01-27 VITALS — BP 130/78 | HR 96 | Temp 97.6°F | Ht 60.0 in | Wt 180.8 lb

## 2012-01-27 DIAGNOSIS — R0600 Dyspnea, unspecified: Secondary | ICD-10-CM

## 2012-01-27 DIAGNOSIS — I10 Essential (primary) hypertension: Secondary | ICD-10-CM

## 2012-01-27 DIAGNOSIS — J8410 Pulmonary fibrosis, unspecified: Secondary | ICD-10-CM

## 2012-01-27 DIAGNOSIS — R0989 Other specified symptoms and signs involving the circulatory and respiratory systems: Secondary | ICD-10-CM

## 2012-01-27 DIAGNOSIS — G4700 Insomnia, unspecified: Secondary | ICD-10-CM

## 2012-01-27 DIAGNOSIS — R0609 Other forms of dyspnea: Secondary | ICD-10-CM

## 2012-01-27 LAB — CBC WITH DIFFERENTIAL/PLATELET
Basophils Absolute: 0 10*3/uL (ref 0.0–0.1)
Basophils Relative: 0.3 % (ref 0.0–3.0)
Eosinophils Absolute: 0.2 10*3/uL (ref 0.0–0.7)
HCT: 40.3 % (ref 36.0–46.0)
Hemoglobin: 13.6 g/dL (ref 12.0–15.0)
Lymphs Abs: 1.3 10*3/uL (ref 0.7–4.0)
MCHC: 33.7 g/dL (ref 30.0–36.0)
MCV: 93.6 fl (ref 78.0–100.0)
Monocytes Absolute: 0.6 10*3/uL (ref 0.1–1.0)
Neutro Abs: 3.4 10*3/uL (ref 1.4–7.7)
RBC: 4.31 Mil/uL (ref 3.87–5.11)
RDW: 13.8 % (ref 11.5–14.6)

## 2012-01-27 LAB — COMPREHENSIVE METABOLIC PANEL
ALT: 14 U/L (ref 0–35)
AST: 23 U/L (ref 0–37)
Alkaline Phosphatase: 68 U/L (ref 39–117)
Calcium: 9 mg/dL (ref 8.4–10.5)
Chloride: 102 mEq/L (ref 96–112)
Creatinine, Ser: 0.7 mg/dL (ref 0.4–1.2)

## 2012-01-27 MED ORDER — MUCINEX DM 30-600 MG PO TB12: 1.0000 | ORAL_TABLET | Freq: Two times a day (BID) | ORAL | Status: DC

## 2012-01-27 MED ORDER — ZOLPIDEM TARTRATE 5 MG PO TABS: ORAL_TABLET | ORAL | Status: DC

## 2012-01-27 MED ORDER — SERTRALINE HCL 100 MG PO TABS: 200.0000 mg | ORAL_TABLET | Freq: Every day | ORAL | Status: DC

## 2012-01-27 MED ORDER — OXCARBAZEPINE 300 MG PO TABS: ORAL_TABLET | ORAL | Status: DC

## 2012-01-27 NOTE — Assessment & Plan Note (Signed)
Symptoms of insomnia persistent, but well controlled with ambien. Will continue.

## 2012-01-27 NOTE — Assessment & Plan Note (Signed)
Blood pressure well-controlled on current medications. Will continue. Will check renal function with labs today. Followup in 3 months or sooner as needed. 

## 2012-01-27 NOTE — Assessment & Plan Note (Signed)
Symptomatically doing fairly well. Off supplemental oxygen. Continues to live independently and has some dyspnea performing ADLs. Will set up cardiopulmonary rehabilitation to see if any improvement. Patient will continue to follow with pulmonary medicine.

## 2012-01-27 NOTE — Progress Notes (Signed)
Subjective:    Patient ID: Bianca Gomez, female    DOB: 03/31/1927, 79 y.o.   MRN: [id number]  HPI  79 year old female with history of pulmonary fibrosis, adrenal insufficiency, hypertension presents for followup. She reports she is generally doing well. She reports that she was seen by her pulmonologist in supplemental oxygen was discontinued. She has been able to function fairly well at home with some minimal dyspnea with exertional activity. She is able to perform light housework. She is interested in participating in cardiopulmonary rehabilitation to improve her exercise capacity. She notes some increased mucus production and cough over the last couple of weeks. She denies any fever or chills. She denies chest pain.  Regards to chronic insomnia, she reports that symptoms are well controlled with use of Ambien. She denies any daytime fatigue with this medication.   Outpatient Encounter Prescriptions as of 01/27/2012  Medication Sig Dispense Refill  . albuterol (PROVENTIL HFA;VENTOLIN HFA) 108 (90 BASE) MCG/ACT inhaler Inhale 2 puffs into the lungs as needed.      Marland Kitchen alendronate (FOSAMAX) 70 MG tablet Take 70 mg by mouth every 7 (seven) days. Take with a full glass of water on an empty stomach.      Marland Kitchen aspirin 81 MG tablet Take 81 mg by mouth daily.        . Calcium Carbonate-Vitamin D (CALCIUM 600-D) 600-400 MG-UNIT per tablet Take 2 tablets by mouth daily.        . Cholecalciferol (VITAMIN D) 2000 UNITS CAPS Take 1 capsule by mouth daily.        Marland Kitchen Dextromethorphan-Guaifenesin (MUCINEX DM) 30-600 MG TB12 Take 1 tablet by mouth 2 (two) times daily.  28 each  0  . fluticasone (FLONASE) 50 MCG/ACT nasal spray Place 2 sprays into the nose daily.        . folic acid (FOLVITE) 400 MCG tablet Take 400 mcg by mouth daily.        Marland Kitchen gabapentin (NEURONTIN) 100 MG capsule Take 100 mg by mouth 2 (two) times daily.      Marland Kitchen letrozole (FEMARA) 2.5 MG tablet Take 2.5 mg by mouth daily.        Marland Kitchen levothyroxine  (SYNTHROID, LEVOTHROID) 125 MCG tablet TAKE 1 TABLET BY MOUTH EVERY DAY  30 tablet  5  . Multiple Vitamin (MULTIVITAMIN) capsule Take 1 capsule by mouth daily.        . Oxcarbazepine (TRILEPTAL) 300 MG tablet 300mg  po qam and 600mg  po qpm  90 tablet  3  . sertraline (ZOLOFT) 100 MG tablet Take 2 tablets (200 mg total) by mouth daily.  60 tablet  3  . torsemide (DEMADEX) 10 MG tablet TAKE 1 TABLET (10 MG TOTAL) BY MOUTH DAILY.  30 tablet  3  . vitamin C (ASCORBIC ACID) 500 MG tablet Take 500 mg by mouth daily.        Marland Kitchen zolpidem (AMBIEN) 5 MG tablet TAKE 1 TABLET AT BEDTIME AS NEEDED  30 tablet  4   BP 130/78  Pulse 96  Temp 97.6 F (36.4 C) (Oral)  Ht 5' (1.524 m)  Wt 180 lb 12 oz (81.988 kg)  BMI 35.30 kg/m2  SpO2 92%  Review of Systems  Constitutional: Negative for fever, chills, appetite change, fatigue and unexpected weight change.  HENT: Negative for ear pain, congestion, sore throat, trouble swallowing, neck pain, voice change and sinus pressure.   Eyes: Negative for visual disturbance.  Respiratory: Positive for shortness of breath. Negative for cough, wheezing  and stridor.   Cardiovascular: Negative for chest pain, palpitations and leg swelling.  Gastrointestinal: Negative for nausea, vomiting, abdominal pain, diarrhea, constipation, blood in stool, abdominal distention and anal bleeding.  Genitourinary: Negative for dysuria and flank pain.  Musculoskeletal: Negative for myalgias, arthralgias and gait problem.  Skin: Negative for color change and rash.  Neurological: Negative for dizziness and headaches.  Hematological: Negative for adenopathy. Does not bruise/bleed easily.  Psychiatric/Behavioral: Positive for sleep disturbance. Negative for suicidal ideas and dysphoric mood. The patient is not nervous/anxious.        Objective:   Physical Exam  Constitutional: She is oriented to person, place, and time. She appears well-developed and well-nourished. No distress.  HENT:    Head: Normocephalic and atraumatic.  Right Ear: External ear normal.  Left Ear: External ear normal.  Nose: Nose normal.  Mouth/Throat: Oropharynx is clear and moist. No oropharyngeal exudate.  Eyes: Conjunctivae normal are normal. Pupils are equal, round, and reactive to light. Right eye exhibits no discharge. Left eye exhibits no discharge. No scleral icterus.  Neck: Normal range of motion. Neck supple. No tracheal deviation present. No thyromegaly present.  Cardiovascular: Normal rate, regular rhythm, normal heart sounds and intact distal pulses.  Exam reveals no gallop and no friction rub.   No murmur heard. Pulmonary/Chest: Breath sounds normal. Accessory muscle usage (with exertion) present. Not tachypneic. No respiratory distress. She has no decreased breath sounds. She has no wheezes. She has no rhonchi. She has no rales. She exhibits no tenderness.  Musculoskeletal: Normal range of motion. She exhibits no edema and no tenderness.  Lymphadenopathy:    She has no cervical adenopathy.  Neurological: She is alert and oriented to person, place, and time. No cranial nerve deficit. She exhibits normal muscle tone. Coordination normal.  Skin: Skin is warm and dry. No rash noted. She is not diaphoretic. No erythema. No pallor.  Psychiatric: She has a normal mood and affect. Her behavior is normal. Judgment and thought content normal.          Assessment & Plan:

## 2012-02-05 ENCOUNTER — Other Ambulatory Visit: Payer: Self-pay | Admitting: Internal Medicine

## 2012-02-13 ENCOUNTER — Other Ambulatory Visit: Payer: Self-pay | Admitting: Internal Medicine

## 2012-02-13 NOTE — Telephone Encounter (Signed)
rx called into pharmacy

## 2012-03-16 ENCOUNTER — Encounter: Payer: Self-pay | Admitting: *Deleted

## 2012-03-16 DIAGNOSIS — M1990 Unspecified osteoarthritis, unspecified site: Secondary | ICD-10-CM | POA: Insufficient documentation

## 2012-03-16 DIAGNOSIS — I272 Pulmonary hypertension, unspecified: Secondary | ICD-10-CM | POA: Insufficient documentation

## 2012-03-16 DIAGNOSIS — C801 Malignant (primary) neoplasm, unspecified: Secondary | ICD-10-CM | POA: Insufficient documentation

## 2012-03-16 DIAGNOSIS — Z8614 Personal history of Methicillin resistant Staphylococcus aureus infection: Secondary | ICD-10-CM | POA: Insufficient documentation

## 2012-03-23 ENCOUNTER — Telehealth: Payer: Self-pay | Admitting: *Deleted

## 2012-03-23 ENCOUNTER — Ambulatory Visit: Payer: Self-pay | Admitting: General Surgery

## 2012-03-23 NOTE — Telephone Encounter (Signed)
Called [phone] for prior authorization in the Zolpidem tartrate 5 mg, form is being faxed over now

## 2012-03-25 ENCOUNTER — Ambulatory Visit (INDEPENDENT_AMBULATORY_CARE_PROVIDER_SITE_OTHER): Payer: Medicare Other | Admitting: General Surgery

## 2012-03-25 ENCOUNTER — Encounter: Payer: Self-pay | Admitting: General Surgery

## 2012-03-25 VITALS — BP 130/72 | HR 74 | Resp 20 | Ht 61.0 in | Wt 174.0 lb

## 2012-03-25 DIAGNOSIS — C50912 Malignant neoplasm of unspecified site of left female breast: Secondary | ICD-10-CM

## 2012-03-25 DIAGNOSIS — Z853 Personal history of malignant neoplasm of breast: Secondary | ICD-10-CM

## 2012-03-25 DIAGNOSIS — C50919 Malignant neoplasm of unspecified site of unspecified female breast: Secondary | ICD-10-CM

## 2012-03-25 DIAGNOSIS — M25519 Pain in unspecified shoulder: Secondary | ICD-10-CM

## 2012-03-25 NOTE — Progress Notes (Signed)
Subjective:     Patient ID: Bianca Gomez, female   DOB: 1927-04-09, 79 y.o.   MRN: [id number]  HPI Patient presents today with a concern about bilateral axillary soreness. She states her range of motion in both arms are limited. This problem started approximately in May 2013 and has progressively gotten worse. She hasn't been able to do physical therapy due to this issue. The patient has a history of breast cancer in the left breast. Patient underwent bilateral mastectomy in 2010 due to unknown family history.    Review of Systems  Constitutional: Negative.   Respiratory: Negative.   Cardiovascular: Negative.        Objective:   Physical Exam  Constitutional: She appears well-developed and well-nourished.  Pulmonary/Chest:  Both mastectomy sites are doing well.  Musculoskeletal:  Both shoulders are limited movement.   There is palpable crepitance in the left shoulder with the slightest motion which is profoundly limited to less than 25 of abduction and minimal anterior rotation. The right shoulder is equally limited in its motion although no palpable crepitance is evident.    Assessment:     Severe arthritis, no evidence of shoulder limitation from previous mastectomy and     Plan:     Orthopedic referral

## 2012-03-25 NOTE — Patient Instructions (Addendum)
Patient to see Dr. Juanell Fairly at [facility] for bilateral frozen shoulder. An appointment has been scheduled for 04-14-12 at 9:45 am. This patient is aware of date, time, and instructions.

## 2012-04-10 ENCOUNTER — Other Ambulatory Visit: Payer: Self-pay | Admitting: Internal Medicine

## 2012-04-12 ENCOUNTER — Encounter: Payer: Self-pay | Admitting: Internal Medicine

## 2012-04-13 ENCOUNTER — Ambulatory Visit: Payer: Self-pay | Admitting: Cardiology

## 2012-04-22 ENCOUNTER — Other Ambulatory Visit: Payer: Self-pay | Admitting: Internal Medicine

## 2012-04-27 ENCOUNTER — Ambulatory Visit: Payer: Self-pay | Admitting: Oncology

## 2012-04-29 ENCOUNTER — Ambulatory Visit (INDEPENDENT_AMBULATORY_CARE_PROVIDER_SITE_OTHER): Payer: Medicare Other | Admitting: Internal Medicine

## 2012-04-29 ENCOUNTER — Encounter: Payer: Self-pay | Admitting: Internal Medicine

## 2012-04-29 VITALS — BP 160/100 | HR 66 | Temp 99.2°F | Wt 180.0 lb

## 2012-04-29 DIAGNOSIS — I272 Pulmonary hypertension, unspecified: Secondary | ICD-10-CM

## 2012-04-29 DIAGNOSIS — J8410 Pulmonary fibrosis, unspecified: Secondary | ICD-10-CM

## 2012-04-29 DIAGNOSIS — D696 Thrombocytopenia, unspecified: Secondary | ICD-10-CM

## 2012-04-29 DIAGNOSIS — M1990 Unspecified osteoarthritis, unspecified site: Secondary | ICD-10-CM

## 2012-04-29 DIAGNOSIS — E039 Hypothyroidism, unspecified: Secondary | ICD-10-CM

## 2012-04-29 DIAGNOSIS — I2789 Other specified pulmonary heart diseases: Secondary | ICD-10-CM

## 2012-04-29 DIAGNOSIS — M810 Age-related osteoporosis without current pathological fracture: Secondary | ICD-10-CM

## 2012-04-29 DIAGNOSIS — M129 Arthropathy, unspecified: Secondary | ICD-10-CM

## 2012-04-29 DIAGNOSIS — I10 Essential (primary) hypertension: Secondary | ICD-10-CM

## 2012-04-29 LAB — CBC WITH DIFFERENTIAL/PLATELET
Basophils Absolute: 0 10*3/uL (ref 0.0–0.1)
Basophils Relative: 0.3 % (ref 0.0–3.0)
Eosinophils Absolute: 0.1 10*3/uL (ref 0.0–0.7)
Lymphocytes Relative: 15.3 % (ref 12.0–46.0)
MCHC: 33.7 g/dL (ref 30.0–36.0)
MCV: 96.5 fl (ref 78.0–100.0)
Monocytes Absolute: 0.7 10*3/uL (ref 0.1–1.0)
Neutrophils Relative %: 75 % (ref 43.0–77.0)
Platelets: 163 10*3/uL (ref 150.0–400.0)
RDW: 14.7 % — ABNORMAL HIGH (ref 11.5–14.6)

## 2012-04-29 LAB — COMPREHENSIVE METABOLIC PANEL
ALT: 18 U/L (ref 0–35)
AST: 21 U/L (ref 0–37)
Albumin: 3.9 g/dL (ref 3.5–5.2)
Alkaline Phosphatase: 62 U/L (ref 39–117)
BUN: 20 mg/dL (ref 6–23)
Calcium: 8.8 mg/dL (ref 8.4–10.5)
Chloride: 100 mEq/L (ref 96–112)
Potassium: 4.2 mEq/L (ref 3.5–5.1)
Sodium: 136 mEq/L (ref 135–145)
Total Protein: 6.6 g/dL (ref 6.0–8.3)

## 2012-04-29 LAB — T4, FREE: Free T4: 1.03 ng/dL (ref 0.60–1.60)

## 2012-04-29 MED ORDER — SERTRALINE HCL 100 MG PO TABS: 200.0000 mg | ORAL_TABLET | Freq: Every day | ORAL | Status: DC

## 2012-04-29 MED ORDER — HYDROCODONE-ACETAMINOPHEN 5-325 MG PO TABS: 1.0000 | ORAL_TABLET | Freq: Four times a day (QID) | ORAL | Status: DC | PRN

## 2012-04-29 MED ORDER — OXCARBAZEPINE 300 MG PO TABS: ORAL_TABLET | ORAL | Status: DC

## 2012-04-29 NOTE — Assessment & Plan Note (Signed)
Intolerant of bisphosphonates. Will look into coverage for Prolia.

## 2012-04-29 NOTE — Assessment & Plan Note (Signed)
BP Readings from Last 3 Encounters:  04/29/12 160/100  03/25/12 130/72  01/27/12 130/78   BP slightly elevated today, but has been well controlled at home. Will continue to monitor.

## 2012-04-29 NOTE — Assessment & Plan Note (Signed)
Pulmonary hypertension s/p recent right heart cath which reportedly showed increased pressure. Will request notes on this. Discussed referral to Dr. Marlane Mingle at [facility], specialist in pulmonary hypertension. Discussed potential use of Sildenafil to help lower pressures.

## 2012-04-29 NOTE — Assessment & Plan Note (Signed)
Symptoms of arthritis in bilateral shoulders. Followed by orthopedics. Minimal improvement with steroid injections. Next step would be bilateral shoulder replacement, however pt high risk for surgery with ongoing pulmonary hypertension. Will continue pain management. Avoid NSAIDS because of HTN. Will use prn Hydrocodone for severe pain.

## 2012-04-29 NOTE — Progress Notes (Signed)
Subjective:    Patient ID: Bianca Gomez, female    DOB: 1927-05-29, 79 y.o.   MRN: [id number]  HPI 79 year old female with history of hypothyroidism, pulmonary hypertension, adrenal insufficiency, osteoarthritis presents for followup. In the interim since her last visit, she notes progressive shortness of breath with minimal exertion. She underwent a right heart cath with her cardiologist which she reports showed elevated pulmonary pressures. She was instructed to followup with her pulmonologist and this is scheduled for next week. She has not been using supplemental oxygen during the day. She denies any cough or chest pain. She denies fever or chills.  In regards to osteoarthritis, she notes she was seen by orthopedic surgeon to address progressive pain in both of her shoulders. X-rays of her shoulders showed osteoarthritis with significant bony destruction. Best option was felt to be shoulder replacement bilaterally however patient was concerned about risk of anesthesia. She underwent bilateral steroid injections with minimal improvement. She has been taking Tylenol to help with pain symptoms with minimal effects.  Outpatient Encounter Prescriptions as of 04/29/2012  Medication Sig Dispense Refill  . alendronate (FOSAMAX) 70 MG tablet Take 70 mg by mouth every 7 (seven) days. Take with a full glass of water on an empty stomach.      Marland Kitchen aspirin 81 MG tablet Take 81 mg by mouth daily.        . Calcium Carbonate-Vitamin D (CALCIUM 600-D) 600-400 MG-UNIT per tablet Take 2 tablets by mouth daily.        . Cholecalciferol (VITAMIN D) 2000 UNITS CAPS Take 1 capsule by mouth daily.        . fluticasone (FLONASE) 50 MCG/ACT nasal spray Place 2 sprays into the nose daily.        . folic acid (FOLVITE) 400 MCG tablet Take 400 mcg by mouth daily.        Marland Kitchen gabapentin (NEURONTIN) 100 MG capsule Take 100 mg by mouth 2 (two) times daily.      Marland Kitchen letrozole (FEMARA) 2.5 MG tablet Take 2.5 mg by mouth daily.         Marland Kitchen levothyroxine (SYNTHROID, LEVOTHROID) 125 MCG tablet TAKE 1 TABLET BY MOUTH EVERY DAY  30 tablet  5  . Multiple Vitamin (MULTIVITAMIN) capsule Take 1 capsule by mouth daily.        . potassium gluconate 595 MG TABS Take 595 mg by mouth daily.      Marland Kitchen torsemide (DEMADEX) 10 MG tablet TAKE 1 TABLET (10 MG TOTAL) BY MOUTH DAILY.  30 tablet  3  . vitamin C (ASCORBIC ACID) 500 MG tablet Take 500 mg by mouth daily.        Marland Kitchen zolpidem (AMBIEN) 5 MG tablet TAKE 1 TABLET AT BEDTIME AS NEEDED  30 tablet  0  . albuterol (PROVENTIL HFA;VENTOLIN HFA) 108 (90 BASE) MCG/ACT inhaler Inhale 2 puffs into the lungs as needed.      Marland Kitchen Dextromethorphan-Guaifenesin (MUCINEX DM) 30-600 MG TB12 Take 1 tablet by mouth 2 (two) times daily.  28 each  0  . HYDROcodone-acetaminophen (NORCO/VICODIN) 5-325 MG per tablet Take 1 tablet by mouth every 6 (six) hours as needed for pain.  90 tablet  3  . Oxcarbazepine (TRILEPTAL) 300 MG tablet 300mg  po qam and 600mg  po qpm  90 tablet  3  . sertraline (ZOLOFT) 100 MG tablet Take 2 tablets (200 mg total) by mouth daily.  60 tablet  3   No facility-administered encounter medications on file as of 04/29/2012.  BP 160/100  Pulse 66  Temp(Src) 99.2 F (37.3 C) (Oral)  Wt 180 lb (81.647 kg)  BMI 34.03 kg/m2  SpO2 97%  Review of Systems  Constitutional: Negative for fever, chills, appetite change, fatigue and unexpected weight change.  HENT: Negative for ear pain, congestion, sore throat, trouble swallowing, neck pain, voice change and sinus pressure.   Eyes: Negative for visual disturbance.  Respiratory: Positive for shortness of breath. Negative for cough, wheezing and stridor.   Cardiovascular: Negative for chest pain, palpitations and leg swelling.  Gastrointestinal: Negative for nausea, vomiting, abdominal pain, diarrhea, constipation, blood in stool, abdominal distention and anal bleeding.  Genitourinary: Negative for dysuria and flank pain.  Musculoskeletal: Positive for  arthralgias. Negative for myalgias and gait problem.  Skin: Negative for color change and rash.  Neurological: Negative for dizziness and headaches.  Hematological: Negative for adenopathy. Does not bruise/bleed easily.  Psychiatric/Behavioral: Negative for suicidal ideas, sleep disturbance and dysphoric mood. The patient is not nervous/anxious.        Objective:   Physical Exam  Constitutional: She is oriented to person, place, and time. She appears well-developed and well-nourished. No distress.  HENT:  Head: Normocephalic and atraumatic.  Right Ear: External ear normal.  Left Ear: External ear normal.  Nose: Nose normal.  Mouth/Throat: Oropharynx is clear and moist. No oropharyngeal exudate.  Eyes: Conjunctivae are normal. Pupils are equal, round, and reactive to light. Right eye exhibits no discharge. Left eye exhibits no discharge. No scleral icterus.  Neck: Normal range of motion. Neck supple. No tracheal deviation present. No thyromegaly present.  Cardiovascular: Normal rate, regular rhythm, normal heart sounds and intact distal pulses.  Exam reveals no gallop and no friction rub.   No murmur heard. Pulmonary/Chest: Effort normal and breath sounds normal. No respiratory distress. She has no wheezes. She has no rales. She exhibits no tenderness.  Musculoskeletal: She exhibits no edema and no tenderness.       Right shoulder: She exhibits decreased range of motion, tenderness, bony tenderness and pain.       Left shoulder: She exhibits decreased range of motion, tenderness, bony tenderness and pain.  Lymphadenopathy:    She has no cervical adenopathy.  Neurological: She is alert and oriented to person, place, and time. No cranial nerve deficit. She exhibits normal muscle tone. Coordination normal.  Skin: Skin is warm and dry. No rash noted. She is not diaphoretic. No erythema. No pallor.  Psychiatric: She has a normal mood and affect. Her behavior is normal. Judgment and thought  content normal.          Assessment & Plan:

## 2012-04-29 NOTE — Assessment & Plan Note (Signed)
Symptoms recently stable. Follow up with pulmonary medicine scheduled for May. Will follow.

## 2012-05-06 ENCOUNTER — Ambulatory Visit: Payer: Self-pay | Admitting: Oncology

## 2012-05-24 ENCOUNTER — Other Ambulatory Visit: Payer: Self-pay | Admitting: Internal Medicine

## 2012-05-24 NOTE — Telephone Encounter (Signed)
Okay to refill? 

## 2012-06-22 ENCOUNTER — Encounter: Payer: Self-pay | Admitting: *Deleted

## 2012-07-14 ENCOUNTER — Ambulatory Visit: Payer: Self-pay | Admitting: Oncology

## 2012-07-15 ENCOUNTER — Emergency Department: Payer: Self-pay | Admitting: Emergency Medicine

## 2012-08-04 ENCOUNTER — Ambulatory Visit: Payer: Medicare Other | Admitting: Internal Medicine

## 2012-08-09 LAB — COMPREHENSIVE METABOLIC PANEL
BUN: 22 mg/dL — ABNORMAL HIGH (ref 7–18)
Bilirubin,Total: 0.4 mg/dL (ref 0.2–1.0)
Co2: 32 mmol/L (ref 21–32)
EGFR (African American): >60
Glucose: 130 mg/dL — ABNORMAL HIGH (ref 65–99)
Potassium: 3.6 mmol/L (ref 3.5–5.1)
SGPT (ALT): 19 U/L (ref 12–78)
Total Protein: 8.1 g/dL (ref 6.4–8.2)

## 2012-08-09 LAB — CBC
MCV: 94 fL (ref 80–100)
RBC: 4.44 10*6/uL (ref 3.80–5.20)
WBC: 6.6 10*3/uL (ref 3.6–11.0)

## 2012-08-09 LAB — CK TOTAL AND CKMB (NOT AT ARMC)
CK, Total: 53 U/L (ref 21–215)
CK-MB: 0.8 ng/mL (ref 0.5–3.6)

## 2012-08-09 LAB — PROTIME-INR: Prothrombin Time: 15.1 secs — ABNORMAL HIGH (ref 11.5–14.7)

## 2012-08-10 ENCOUNTER — Inpatient Hospital Stay: Payer: Self-pay | Admitting: Internal Medicine

## 2012-08-10 LAB — URINALYSIS, COMPLETE
Bacteria: NONE SEEN
Bilirubin,UR: NEGATIVE
Blood: NEGATIVE
Hyaline Cast: 6
Leukocyte Esterase: NEGATIVE
Protein: 30
Specific Gravity: 1.016 (ref 1.003–1.030)
WBC UR: 2 /HPF (ref 0–5)

## 2012-08-10 LAB — PRO B NATRIURETIC PEPTIDE: B-Type Natriuretic Peptide: 1971 pg/mL — ABNORMAL HIGH (ref 0–450)

## 2012-08-10 LAB — TROPONIN I: Troponin-I: 0.04 ng/mL

## 2012-08-11 LAB — LIPID PANEL
HDL Cholesterol: 79 mg/dL — ABNORMAL HIGH (ref 40–60)
Ldl Cholesterol, Calc: 79 mg/dL (ref 0–100)
Triglycerides: 69 mg/dL (ref 0–200)
VLDL Cholesterol, Calc: 14 mg/dL (ref 5–40)

## 2012-08-13 ENCOUNTER — Telehealth: Payer: Self-pay | Admitting: Internal Medicine

## 2012-08-13 NOTE — Telephone Encounter (Signed)
FYI to Dr. Walker 

## 2012-08-13 NOTE — Telephone Encounter (Signed)
Patient being discharged form [facility] today she has an appointment on the 18th of this month. I did request records the nurse took down our fax number and said give about an hour and they will be here.

## 2012-08-13 NOTE — Telephone Encounter (Signed)
Can you touch base with her and see how she is doing?

## 2012-08-16 ENCOUNTER — Non-Acute Institutional Stay (SKILLED_NURSING_FACILITY): Payer: Medicare Other | Admitting: Internal Medicine

## 2012-08-16 ENCOUNTER — Other Ambulatory Visit: Payer: Self-pay | Admitting: Geriatric Medicine

## 2012-08-16 DIAGNOSIS — M810 Age-related osteoporosis without current pathological fracture: Secondary | ICD-10-CM

## 2012-08-16 DIAGNOSIS — R531 Weakness: Secondary | ICD-10-CM

## 2012-08-16 DIAGNOSIS — I509 Heart failure, unspecified: Secondary | ICD-10-CM | POA: Insufficient documentation

## 2012-08-16 DIAGNOSIS — M129 Arthropathy, unspecified: Secondary | ICD-10-CM

## 2012-08-16 DIAGNOSIS — F32A Depression, unspecified: Secondary | ICD-10-CM

## 2012-08-16 DIAGNOSIS — I639 Cerebral infarction, unspecified: Secondary | ICD-10-CM | POA: Insufficient documentation

## 2012-08-16 DIAGNOSIS — F329 Major depressive disorder, single episode, unspecified: Secondary | ICD-10-CM

## 2012-08-16 DIAGNOSIS — I5022 Chronic systolic (congestive) heart failure: Secondary | ICD-10-CM

## 2012-08-16 DIAGNOSIS — I6350 Cerebral infarction due to unspecified occlusion or stenosis of unspecified cerebral artery: Secondary | ICD-10-CM

## 2012-08-16 DIAGNOSIS — I10 Essential (primary) hypertension: Secondary | ICD-10-CM

## 2012-08-16 DIAGNOSIS — C50919 Malignant neoplasm of unspecified site of unspecified female breast: Secondary | ICD-10-CM

## 2012-08-16 DIAGNOSIS — M1990 Unspecified osteoarthritis, unspecified site: Secondary | ICD-10-CM

## 2012-08-16 DIAGNOSIS — R5381 Other malaise: Secondary | ICD-10-CM

## 2012-08-16 MED ORDER — ZOLPIDEM TARTRATE 5 MG PO TABS: 5.0000 mg | ORAL_TABLET | Freq: Every evening | ORAL | Status: DC | PRN

## 2012-08-16 MED ORDER — HYDROCODONE-ACETAMINOPHEN 5-325 MG PO TABS: 1.0000 | ORAL_TABLET | Freq: Four times a day (QID) | ORAL | Status: AC | PRN

## 2012-08-16 NOTE — Progress Notes (Signed)
Patient ID: Bianca Gomez, female   DOB: 19-Aug-1927, 79 y.o.   MRN: [id number]  [facility]  PCP: Wynona Dove, MD  Code Status: dnr  Allergies  Allergen Reactions  . Lithium     Tremors  . Norvasc (Amlodipine Besylate)     Chief Complaint  Patient presents with  . Hospitalization Follow-up    HPI:  79 y/o female patient was admitted to the hospital from 08/10/12- 08/13/12 with drowsiness, slurred speech followed by LOC. She was admiited for syncopal and possible CVA workup. First ct head did not reveal anything. Repeat ct head in 24 hrs showed right frontal acute stroke with small petechial hemorrhage. She has a pacemaker and thus MRI could not be obtained. teleneurology was consulted and ASA was continued. echocardiogra showed no emboli and carotid usg showed no significant stenosis. Her statin was continued. Her chf remained stable. Given her deconditioning, she was sent to SNF for STR. She was seen in her room today. She is feeling weak and gets tired with minimal exertion Denies any chest pain or SOB No difficulty breathing with lying down Appetite is fair Denies nausea or vomiting  Review of Systems:  denies fever or chills Denies abdominal pain, vomiting, constipation or diarrhea Denies urinary complaints Has arthritis pain Denies numbness or tingling Mood has remained stable  Past Medical History  Diagnosis Date  . Depression   . Hypertension   . Hyperlipidemia   . Thyroid disease   . Cancer 2010    bilateral  . Arthritis   . Obesity   . Pulmonary hypertension 2013  . History of MRSA infection    Past Surgical History  Procedure Laterality Date  . Breast surgery  2010    s/p bilateral mastectomy  . Foot surgery  2006  . Cholecystectomy  1964  . Knee surgery  2003  . Joint replacement Left 2005    hip   . Shoulder surgery  2007  . Phrenic nerve pacemaker implantation  2013  . Skin cancer excision  2012    neck  . Colonoscopy  2003    In  [place]   Social History:   reports that she has never smoked. She has never used smokeless tobacco. She reports that she does not drink alcohol or use illicit drugs.  Family History  Problem Relation Age of Onset  . Cancer Other     unknown family member with breast cancer    Medications: Patient's Medications  New Prescriptions   No medications on file  Previous Medications   ALENDRONATE (FOSAMAX) 70 MG TABLET    Take 70 mg by mouth every 7 (seven) days. Take with a full glass of water on an empty stomach.   ASPIRIN 81 MG TABLET    Take 81 mg by mouth daily.     CALCIUM CARBONATE-VITAMIN D (CALCIUM 600-D) 600-400 MG-UNIT PER TABLET    Take 2 tablets by mouth daily.     FLUTICASONE (FLONASE) 50 MCG/ACT NASAL SPRAY    Place 2 sprays into the nose daily.     FOLIC ACID (FOLVITE) 400 MCG TABLET    Take 400 mcg by mouth daily.     GABAPENTIN (NEURONTIN) 100 MG CAPSULE    Take 100 mg by mouth daily.    LETROZOLE (FEMARA) 2.5 MG TABLET    Take 2.5 mg by mouth daily.     LEVOTHYROXINE (SYNTHROID, LEVOTHROID) 125 MCG TABLET    TAKE 1 TABLET BY MOUTH EVERY DAY   POLYETHYLENE  GLYCOL (MIRALAX / GLYCOLAX) PACKET    Take 17 g by mouth daily as needed.   POTASSIUM GLUCONATE 595 MG TABS    Take 595 mg by mouth daily.   SERTRALINE (ZOLOFT) 100 MG TABLET    Take 2 tablets (200 mg total) by mouth daily.   SIMVASTATIN (ZOCOR) 40 MG TABLET    Take 40 mg by mouth every evening.   TORSEMIDE (DEMADEX) 10 MG TABLET    TAKE 1 TABLET (10 MG TOTAL) BY MOUTH DAILY.   VITAMIN C (ASCORBIC ACID) 500 MG TABLET    Take 500 mg by mouth daily.    Modified Medications   Modified Medication Previous Medication   HYDROCODONE-ACETAMINOPHEN (NORCO/VICODIN) 5-325 MG PER TABLET HYDROcodone-acetaminophen (NORCO/VICODIN) 5-325 MG per tablet      Take 1 tablet by mouth every 8 (eight) hours as needed for pain.    Take 1 tablet by mouth every 6 (six) hours as needed for pain.   OXCARBAZEPINE (TRILEPTAL) 300 MG TABLET  Oxcarbazepine (TRILEPTAL) 300 MG tablet      Take 300 mg by mouth 2 (two) times daily.    300mg  po qam and 600mg  po qpm   ZOLPIDEM (AMBIEN) 5 MG TABLET zolpidem (AMBIEN) 5 MG tablet      Take 1 tablet (5 mg total) by mouth at bedtime as needed for sleep.    TAKE 1 TABLET AT BEDTIME AS NEEDED  Discontinued Medications   ALBUTEROL (PROVENTIL HFA;VENTOLIN HFA) 108 (90 BASE) MCG/ACT INHALER    Inhale 2 puffs into the lungs as needed.   CHOLECALCIFEROL (VITAMIN D) 2000 UNITS CAPS    Take 1 capsule by mouth daily.     DEXTROMETHORPHAN-GUAIFENESIN (MUCINEX DM) 30-600 MG TB12    Take 1 tablet by mouth 2 (two) times daily.   MULTIPLE VITAMIN (MULTIVITAMIN) CAPSULE    Take 1 capsule by mouth daily.       Filed Vitals:   08/16/12 1249  BP: 116/87  Pulse: 62  Temp: 97.8 F (36.6 C)  Resp: 18  SpO2: 96%   Physical Exam  Constitutional: She is oriented to person, place, and time. She appears well-developed and well-nourished. No distress.  HENT:  Head: Normocephalic and atraumatic.  Nose: Nose normal.  Mouth/Throat: Oropharynx is clear and moist.  Eyes: Conjunctivae and EOM are normal. Pupils are equal, round, and reactive to light.  Neck: Normal range of motion. Neck supple. No JVD present. No tracheal deviation present.  Cardiovascular: Normal rate and regular rhythm.   No murmur heard. Pulmonary/Chest: Effort normal and breath sounds normal. No respiratory distress. She has no wheezes. She has no rales. She exhibits no tenderness.  Abdominal: Soft. Bowel sounds are normal. She exhibits no distension.  Musculoskeletal: Normal range of motion. She exhibits no edema.  Able to move all 4, weakness present, was using roller walker with seat at home  Lymphadenopathy:    She has no cervical adenopathy.  Neurological: She is alert and oriented to person, place, and time. No cranial nerve deficit.  Normal speech  Skin: Skin is warm and dry. She is not diaphoretic. No erythema.  Psychiatric: She  has a normal mood and affect. Her behavior is normal.     Labs reviewed: Basic Metabolic Panel:  Recent Labs  96/04/54 1403 01/27/12 1032 04/29/12 1038  NA 139 136 136  K 3.5 3.8 4.2  CL 99 102 100  CO2 29 27 31   GLUCOSE 120* 109* 106*  BUN 17 17 20   CREATININE 0.7 0.7  0.8  CALCIUM 9.1 9.0 8.8  MG 1.9  --   --    Liver Function Tests:  Recent Labs  09/05/11 1403 01/27/12 1032 04/29/12 1038  AST 29 23 21   ALT 16 14 18   ALKPHOS 60 68 62  BILITOT 0.3 0.5 0.5  PROT 7.3 7.2 6.6  ALBUMIN 4.0 3.8 3.9   CBC:  Recent Labs  09/05/11 1403 01/27/12 1032 04/29/12 1038  WBC 8.3 5.5 8.8  NEUTROABS 7.0 3.4 6.6  HGB 12.6 13.6 14.3  HCT 37.7 40.3 42.4  MCV 92.8 93.6 96.5  PLT 163.0 149.0* 163.0    08/16/12 wbc 6.2, hb 15.1, hct 45.5, mcv 95.6, plt 157, na 138, k 3.6, bun 24, cr 0.8,ca 9.5   Assessment/Plan   Hypertension- will start her on metoprolol succinate 12.5 mg bid for now with holding parameters, monitor bp daily for now  Generalized weakness- will need to work with PT and OT for muscle strengthening and gait stability. Encourage po intake  CVA- new stroke, monitor bp. Continue asa and statin  chf- symptoms stable. Monitor weight and continue torsemide. Monitor bmp, continue potassium gluconate  Hyperlipidemia- stable, continue zocor  Insomnia- continue prn ambien for now and monitor  Depression- cotninue zoloft and trileptal for now  Arthritis - better controlled with current pain regimen of vicodin prn and gabapentin. Continue bowel regimen Osteoporosis- continue fosamax, ca-vit d  Breast cancer- continue femara  Hypothyroidism- continue levothyroxine for now, stable  Family/ staff Communication: reviewed care plan with patient and nursing   Goals of care: return home after STR

## 2012-08-16 NOTE — Telephone Encounter (Signed)
Left message to call back  

## 2012-08-18 ENCOUNTER — Non-Acute Institutional Stay (SKILLED_NURSING_FACILITY): Payer: Medicare Other | Admitting: Adult Health

## 2012-08-18 DIAGNOSIS — I509 Heart failure, unspecified: Secondary | ICD-10-CM

## 2012-08-18 DIAGNOSIS — F3289 Other specified depressive episodes: Secondary | ICD-10-CM

## 2012-08-18 DIAGNOSIS — E2749 Other adrenocortical insufficiency: Secondary | ICD-10-CM

## 2012-08-18 DIAGNOSIS — M810 Age-related osteoporosis without current pathological fracture: Secondary | ICD-10-CM

## 2012-08-18 DIAGNOSIS — E039 Hypothyroidism, unspecified: Secondary | ICD-10-CM

## 2012-08-18 DIAGNOSIS — J8410 Pulmonary fibrosis, unspecified: Secondary | ICD-10-CM

## 2012-08-18 DIAGNOSIS — C50919 Malignant neoplasm of unspecified site of unspecified female breast: Secondary | ICD-10-CM

## 2012-08-18 DIAGNOSIS — I639 Cerebral infarction, unspecified: Secondary | ICD-10-CM

## 2012-08-18 DIAGNOSIS — J309 Allergic rhinitis, unspecified: Secondary | ICD-10-CM

## 2012-08-18 DIAGNOSIS — R55 Syncope and collapse: Secondary | ICD-10-CM

## 2012-08-18 DIAGNOSIS — F329 Major depressive disorder, single episode, unspecified: Secondary | ICD-10-CM

## 2012-08-18 DIAGNOSIS — E2740 Unspecified adrenocortical insufficiency: Secondary | ICD-10-CM

## 2012-08-18 DIAGNOSIS — I10 Essential (primary) hypertension: Secondary | ICD-10-CM

## 2012-08-18 DIAGNOSIS — G609 Hereditary and idiopathic neuropathy, unspecified: Secondary | ICD-10-CM

## 2012-08-18 DIAGNOSIS — E785 Hyperlipidemia, unspecified: Secondary | ICD-10-CM

## 2012-08-18 DIAGNOSIS — G4700 Insomnia, unspecified: Secondary | ICD-10-CM

## 2012-08-18 DIAGNOSIS — I6350 Cerebral infarction due to unspecified occlusion or stenosis of unspecified cerebral artery: Secondary | ICD-10-CM

## 2012-08-18 NOTE — Telephone Encounter (Signed)
LMTCB

## 2012-08-19 ENCOUNTER — Other Ambulatory Visit: Payer: Self-pay

## 2012-08-19 ENCOUNTER — Encounter: Payer: Self-pay | Admitting: Adult Health

## 2012-08-19 ENCOUNTER — Non-Acute Institutional Stay (SKILLED_NURSING_FACILITY): Payer: Medicare Other | Admitting: Adult Health

## 2012-08-19 DIAGNOSIS — E785 Hyperlipidemia, unspecified: Secondary | ICD-10-CM | POA: Insufficient documentation

## 2012-08-19 DIAGNOSIS — E039 Hypothyroidism, unspecified: Secondary | ICD-10-CM | POA: Insufficient documentation

## 2012-08-19 DIAGNOSIS — R55 Syncope and collapse: Secondary | ICD-10-CM

## 2012-08-19 LAB — CBC WITH DIFFERENTIAL/PLATELET
Basophil #: 0.1 10*3/uL (ref 0.0–0.1)
Eosinophil #: 0.1 10*3/uL (ref 0.0–0.7)
Eosinophil %: 1.4 %
HCT: 43.9 % (ref 35.0–47.0)
Lymphocyte #: 1.2 10*3/uL (ref 1.0–3.6)
Lymphocyte %: 13.8 %
MCH: 33 pg (ref 26.0–34.0)
MCHC: 35.4 g/dL (ref 32.0–36.0)
MCV: 93 fL (ref 80–100)
Monocyte %: 10.5 %
Neutrophil %: 73.7 %
Platelet: 160 10*3/uL (ref 150–440)
RBC: 4.72 10*6/uL (ref 3.80–5.20)
WBC: 8.8 10*3/uL (ref 3.6–11.0)

## 2012-08-19 LAB — BASIC METABOLIC PANEL
Anion Gap: 5 — ABNORMAL LOW (ref 7–16)
BUN: 23 mg/dL — ABNORMAL HIGH (ref 7–18)
Calcium, Total: 9.1 mg/dL (ref 8.5–10.1)
Chloride: 99 mmol/L (ref 98–107)
Co2: 34 mmol/L — ABNORMAL HIGH (ref 21–32)
Creatinine: 0.77 mg/dL (ref 0.60–1.30)
EGFR (African American): >60
EGFR (Non-African Amer.): >60
Glucose: 134 mg/dL — ABNORMAL HIGH (ref 65–99)
Osmolality: 281 (ref 275–301)
Potassium: 3.5 mmol/L (ref 3.5–5.1)
Sodium: 138 mmol/L (ref 136–145)

## 2012-08-19 NOTE — Assessment & Plan Note (Addendum)
Her mood state is stable will continue zoloft 200 mg daily and trileptal 300 mg in th am and 600 mg in the pm to help stabilize mood and will monitor her status

## 2012-08-19 NOTE — Assessment & Plan Note (Signed)
Will continue synthroid 125 mcg daily

## 2012-08-19 NOTE — Assessment & Plan Note (Signed)
She is stable will continue demedex 10 mg daily with potassium gluconate 595 mg daily and will monitor

## 2012-08-19 NOTE — Assessment & Plan Note (Signed)
Will continue zocor 40 mg daily and will monitor

## 2012-08-19 NOTE — Assessment & Plan Note (Signed)
Will continue asa 81 mg daily and will monitor  

## 2012-08-19 NOTE — Assessment & Plan Note (Signed)
She is presently not taking medications for this disease; will continue to monitor her status will not make changes at this time.

## 2012-08-19 NOTE — Assessment & Plan Note (Signed)
Her status is unchanged; is O2 dependent; will not make changes will monitor her status

## 2012-08-19 NOTE — Assessment & Plan Note (Signed)
Is stable will continue lopressor 12.5 mg twice daily and will monitor her status

## 2012-08-20 ENCOUNTER — Non-Acute Institutional Stay (SKILLED_NURSING_FACILITY): Payer: Medicare Other | Admitting: Adult Health

## 2012-08-20 ENCOUNTER — Encounter: Payer: Self-pay | Admitting: Internal Medicine

## 2012-08-20 ENCOUNTER — Encounter: Payer: Self-pay | Admitting: Adult Health

## 2012-08-20 ENCOUNTER — Other Ambulatory Visit: Payer: Self-pay

## 2012-08-20 ENCOUNTER — Ambulatory Visit: Payer: Medicare Other | Admitting: Internal Medicine

## 2012-08-20 DIAGNOSIS — R55 Syncope and collapse: Secondary | ICD-10-CM | POA: Insufficient documentation

## 2012-08-20 DIAGNOSIS — R252 Cramp and spasm: Secondary | ICD-10-CM

## 2012-08-20 DIAGNOSIS — J309 Allergic rhinitis, unspecified: Secondary | ICD-10-CM | POA: Insufficient documentation

## 2012-08-20 DIAGNOSIS — G609 Hereditary and idiopathic neuropathy, unspecified: Secondary | ICD-10-CM | POA: Insufficient documentation

## 2012-08-20 DIAGNOSIS — I495 Sick sinus syndrome: Secondary | ICD-10-CM

## 2012-08-20 NOTE — Assessment & Plan Note (Signed)
Will continue flonase daily

## 2012-08-20 NOTE — Assessment & Plan Note (Signed)
Will continue her ambien 5 mg nightly and will monitor

## 2012-08-20 NOTE — Assessment & Plan Note (Signed)
Will continue her letrozole 2.5 mg daily and will monitor

## 2012-08-20 NOTE — Assessment & Plan Note (Signed)
She has had a syncopal episode today during therapy. She states she had this happen about one year ago and was related to blood pressure issues. She also had this prior to her hospitalization will begin her orthostatic vital signs and will follow up in the am and will monitor her status

## 2012-08-20 NOTE — Progress Notes (Signed)
Patient ID: Bianca Gomez, female   DOB: [birth year], 79 y.o.   MRN: [id number]  [facility]  Allergies  Allergen Reactions  . Lithium     Tremors  . Norvasc [Amlodipine Besylate]       Chief Complaint  Patient presents with  . Acute Visit    orthostatic hypotension    HPI: The nursing staff did perform orthostatic vital signs this morning; however; they are no certain about the results they received. Her blood pressure became very elevated with sitting up. i have been asked to recheck her vital signs to ensure the accuracy of their readings. Upon sitting up she became weak; had difficulty hold up her head; denied any dizziness; chest pain or blurred vision. Upon attempting to have her stand she became cyanotic and passed out. She did recover in less than 2 minutes she denied having any symptoms before her syncopal episode. She was slightly confused; but did not have any incontinence.   Past Medical History  Diagnosis Date  . Depression   . Hypertension   . Hyperlipidemia   . Thyroid disease   . Cancer 2010    bilateral  . Arthritis   . Obesity   . Pulmonary hypertension 2013  . History of MRSA infection     Past Surgical History  Procedure Laterality Date  . Breast surgery  2010    s/p bilateral mastectomy  . Foot surgery  2006  . Cholecystectomy  1964  . Knee surgery  2003  . Joint replacement Left 2005    hip   . Shoulder surgery  2007  . Phrenic nerve pacemaker implantation  2013  . Skin cancer excision  2012    neck  . Colonoscopy  2003    In [place]  . Pacemaker insertion      VITAL SIGNS BP 132/70  Pulse 70  Ht 5\' 1"  (1.549 m)  Wt 174 lb (78.926 kg)  BMI 32.89 kg/m2   Patient's Medications  New Prescriptions   No medications on file  Previous Medications   ALENDRONATE (FOSAMAX) 70 MG TABLET    Take 70 mg by mouth every 7 (seven) days. Take with a full glass of water on an empty stomach.   ASPIRIN 81 MG TABLET    Take 81 mg by mouth daily.      CALCIUM CARBONATE-VITAMIN D (CALCIUM 600-D) 600-400 MG-UNIT PER TABLET    Take 1 tablet by mouth 2 (two) times daily.    FLUTICASONE (FLONASE) 50 MCG/ACT NASAL SPRAY    Place 2 sprays into the nose daily.     FOLIC ACID (FOLVITE) 400 MCG TABLET    Take 400 mcg by mouth daily.     HYDROCODONE-ACETAMINOPHEN (NORCO/VICODIN) 5-325 MG PER TABLET    Take 1 tablet by mouth every 6 (six) hours as needed for pain.   LETROZOLE (FEMARA) 2.5 MG TABLET    Take 2.5 mg by mouth daily.     LEVOTHYROXINE (SYNTHROID, LEVOTHROID) 125 MCG TABLET    TAKE 1 TABLET BY MOUTH EVERY DAY   METOPROLOL TARTRATE (LOPRESSOR) 25 MG TABLET    Take 12.5 mg by mouth 2 (two) times daily.   OXCARBAZEPINE (TRILEPTAL) 300 MG TABLET    Take 300 mg by mouth 2 (two) times daily. Take 300 mg in the AM and 600 mg in the PM   POLYETHYLENE GLYCOL (MIRALAX / GLYCOLAX) PACKET    Take 17 g by mouth daily as needed.   POTASSIUM GLUCONATE 595 MG TABS  Take 595 mg by mouth daily.   SERTRALINE (ZOLOFT) 100 MG TABLET    Take 2 tablets (200 mg total) by mouth daily.   SIMVASTATIN (ZOCOR) 40 MG TABLET    Take 40 mg by mouth every evening.   TORSEMIDE (DEMADEX) 10 MG TABLET    TAKE 1 TABLET (10 MG TOTAL) BY MOUTH DAILY.   VITAMIN C (ASCORBIC ACID) 500 MG TABLET    Take 500 mg by mouth daily.     ZOLPIDEM (AMBIEN) 5 MG TABLET    Take 1 tablet (5 mg total) by mouth at bedtime as needed for sleep.  Modified Medications   No medications on file  Discontinued Medications   GABAPENTIN (NEURONTIN) 100 MG CAPSULE    Take 100 mg by mouth daily.     SIGNIFICANT DIAGNOSTIC EXAMS   08-09-12: chest x-ray: pulmonary edema; pacemaker device was noted.  08-09-12: ct of head: no acute abnormalities 08-09-12: ekg: nsr; St-T wave abnormalities in the inferolateral leads.  08-10-12: REPEAT ct of head: evolviung right frontal acute ischemic stroke with petechial hemorrhage small 08/2012: 2-4 echo: ef 60-65% mild diastolic dysfunction 08/2012: carotid doppler: no  significant stenosis   LABS REVIEWED  04-29-12: wbc 8.8; hgb 14.3; hct 42.4 ;mcv 96.5 plt 163; glucose 106; bun 20; creat 0.8; k+4.2;  Na++ 136; tsh 0.74 08-16-12: wbc 6.2; h gb 15.1; hct 45.5; mcv 95.6 ;plt 157; glucose 111; bun 24; creat 0.8; k+3.6 Na++ 138   Review of Systems  Constitutional: Negative for malaise/fatigue.  Eyes: Negative for blurred vision and double vision.  Respiratory: Negative for cough, shortness of breath and wheezing.   Cardiovascular: Negative for chest pain, palpitations and leg swelling.  Gastrointestinal: Negative for heartburn and abdominal pain.  Musculoskeletal: Negative for myalgias and joint pain.  Skin: Negative.   Neurological: Negative for dizziness, sensory change, focal weakness and headaches.  Psychiatric/Behavioral: The patient is not nervous/anxious and does not have insomnia.     Physical Exam  Constitutional: She is oriented to person, place, and time. She appears well-developed and well-nourished.  Neck: Neck supple. No JVD present. No thyromegaly present.  Cardiovascular: Normal rate, regular rhythm and intact distal pulses.   Respiratory: Effort normal. No respiratory distress. She has no wheezes.  Breath sounds are diminished   GI: Soft. Bowel sounds are normal. She exhibits no distension. There is no tenderness.  Musculoskeletal: Normal range of motion. She exhibits no edema.  Neurological: She is alert and oriented to person, place, and time.  Skin: Skin is warm and dry.  Psychiatric: She has a normal mood and affect.      ASSESSMENT/ PLAN:  Syncope She was unable to tolerate the orthostatic vital signs; she had a syncopal episode with cyanosis present. Will place her on bedrest at this time will get an ekg; chest x-ray; cbc; bmp. Will have her return to her cardiologist as soon as possible and will also have her return to her pulmonologist; will continue to monitor her status     Time spent with patient 45 minutes.

## 2012-08-20 NOTE — Assessment & Plan Note (Signed)
She was unable to tolerate the orthostatic vital signs; she had a syncopal episode with cyanosis present. Will place her on bedrest at this time will get an ekg; chest x-ray; cbc; bmp. Will have her return to her cardiologist as soon as possible and will also have her return to her pulmonologist; will continue to monitor her status

## 2012-08-20 NOTE — Assessment & Plan Note (Signed)
Will stop her neurontin as she states it does not help with her neuropathy and will monitor

## 2012-08-20 NOTE — Progress Notes (Signed)
Patient ID: Bianca Gomez, female   DOB: July 09, 1927, 79 y.o.   MRN: [id number]  [facility]  Allergies  Allergen Reactions  . Lithium     Tremors  . Norvasc [Amlodipine Besylate]     Chief Complaint  Patient presents with  . Acute Visit    sycnope      HPI: During therapy she had a syncopal episode lasting 5 minutes. When she awoke she was slightly confused; but did not have any incontinence. She did have this happen prior to her hospitalization for her cva; and she states about one year ago; which was found to be related to her blood pressure. She is not voicing any complaints of weakness; shortness of breath; chest pain; blurred vision or headache.    Past Medical History  Diagnosis Date  . Depression   . Hypertension   . Hyperlipidemia   . Thyroid disease   . Cancer 2010    bilateral  . Arthritis   . Obesity   . Pulmonary hypertension 2013  . History of MRSA infection    Past Surgical History  Procedure Laterality Date  . Breast surgery  2010    s/p bilateral mastectomy  . Foot surgery  2006  . Cholecystectomy  1964  . Knee surgery  2003  . Joint replacement Left 2005    hip   . Shoulder surgery  2007  . Phrenic nerve pacemaker implantation  2013  . Skin cancer excision  2012    neck  . Colonoscopy  2003    In [place]  . Pacemaker insertion       VITAL SIGNS BP 139/68  Pulse 68  Ht 5\' 1"  (1.549 m)  Wt 174 lb (78.926 kg)  BMI 32.89 kg/m2   Patient's Medications  New Prescriptions   No medications on file  Previous Medications   ALENDRONATE (FOSAMAX) 70 MG TABLET    Take 70 mg by mouth every 7 (seven) days. Take with a full glass of water on an empty stomach.   ASPIRIN 81 MG TABLET    Take 81 mg by mouth daily.     CALCIUM CARBONATE-VITAMIN D (CALCIUM 600-D) 600-400 MG-UNIT PER TABLET    Take 1 tablet by mouth 2 (two) times daily.    FLUTICASONE (FLONASE) 50 MCG/ACT NASAL SPRAY    Place 2 sprays into the nose daily.     FOLIC ACID (FOLVITE) 400  MCG TABLET    Take 400 mcg by mouth daily.     GABAPENTIN (NEURONTIN) 100 MG CAPSULE    Take 100 mg by mouth daily.    HYDROCODONE-ACETAMINOPHEN (NORCO/VICODIN) 5-325 MG PER TABLET    Take 1 tablet by mouth every 6 (six) hours as needed for pain.   LETROZOLE (FEMARA) 2.5 MG TABLET    Take 2.5 mg by mouth daily.     LEVOTHYROXINE (SYNTHROID, LEVOTHROID) 125 MCG TABLET    TAKE 1 TABLET BY MOUTH EVERY DAY   METOPROLOL TARTRATE (LOPRESSOR) 25 MG TABLET    Take 12.5 mg by mouth 2 (two) times daily.   OXCARBAZEPINE (TRILEPTAL) 300 MG TABLET    Take 300 mg by mouth 2 (two) times daily. Take 300 mg in the AM and 600 mg in the PM   POLYETHYLENE GLYCOL (MIRALAX / GLYCOLAX) PACKET    Take 17 g by mouth daily as needed.   POTASSIUM GLUCONATE 595 MG TABS    Take 595 mg by mouth daily.   SERTRALINE (ZOLOFT) 100 MG TABLET  Take 2 tablets (200 mg total) by mouth daily.   SIMVASTATIN (ZOCOR) 40 MG TABLET    Take 40 mg by mouth every evening.   TORSEMIDE (DEMADEX) 10 MG TABLET    TAKE 1 TABLET (10 MG TOTAL) BY MOUTH DAILY.   VITAMIN C (ASCORBIC ACID) 500 MG TABLET    Take 500 mg by mouth daily.     ZOLPIDEM (AMBIEN) 5 MG TABLET    Take 1 tablet (5 mg total) by mouth at bedtime as needed for sleep.  Modified Medications   No medications on file  Discontinued Medications   HYDROCODONE-ACETAMINOPHEN (NORCO/VICODIN) 5-325 MG PER TABLET    Take 1 tablet by mouth every 8 (eight) hours as needed for pain.    SIGNIFICANT DIAGNOSTIC EXAMS  08-09-12: chest x-ray: pulmonary edema; pacemaker device was noted.  08-09-12: ct of head: no acute abnormalities 08-09-12: ekg: nsr; St-T wave abnormalities in the inferolateral leads.  08-10-12: REPEAT ct of head: evolviung right frontal acute ischemic stroke with petechial hemorrhage small 08/2012: 2-4 echo: ef 60-65% mild diastolic dysfunction 08/2012: carotid doppler: no significant stenosis   LABS REVIEWED  04-29-12: wbc 8.8; hgb 14.3; hct 42.4 ;mcv 96.5 plt 163; glucose 106;  bun 20; creat 0.8; k+4.2;  Na++ 136; tsh 0.74 08-16-12: wbc 6.2; h gb 15.1; hct 45.5; mcv 95.6 ;plt 157; glucose 111; bun 24; creat 0.8; k+3.6 Na++ 138   Review of Systems  Constitutional: Negative for malaise/fatigue.  Eyes: Negative for blurred vision and double vision.  Respiratory: Negative for cough, shortness of breath and wheezing.   Cardiovascular: Negative for chest pain, palpitations and leg swelling.  Gastrointestinal: Negative for heartburn and abdominal pain.  Musculoskeletal: Negative for myalgias and joint pain.  Skin: Negative.   Neurological: Negative for dizziness, sensory change, focal weakness and headaches.  Psychiatric/Behavioral: The patient is not nervous/anxious and does not have insomnia.     Physical Exam  Constitutional: She is oriented to person, place, and time. She appears well-developed and well-nourished.  Neck: Neck supple. No JVD present. No thyromegaly present.  Cardiovascular: Normal rate, regular rhythm and intact distal pulses.   Respiratory: Effort normal. No respiratory distress. She has no wheezes.  Breath sounds are diminished   GI: Soft. Bowel sounds are normal. She exhibits no distension. There is no tenderness.  Musculoskeletal: Normal range of motion. She exhibits no edema.  Neurological: She is alert and oriented to person, place, and time.  Skin: Skin is warm and dry.  Psychiatric: She has a normal mood and affect.       ASSESSMENT/ PLAN:  Adrenal insufficiency She is presently not taking medications for this disease; will continue to monitor her status will not make changes at this time.   Pulmonary fibrosis Her status is unchanged; is O2 dependent; will not make changes will monitor her status   Hypertension Is stable will continue lopressor 12.5 mg twice daily and will monitor her status   CHF (congestive heart failure) She is stable will continue demedex 10 mg daily with potassium gluconate 595 mg daily and will monitor    Acute CVA (cerebrovascular accident) Will continue asa 81 mg daily and will monitor   Dyslipidemia Will continue zocor 40 mg daily and will monitor   Hypothyroidism Will continue synthroid 125 mcg daily   Depression Her mood state is stable will continue zoloft 200 mg daily and trileptal 300 mg in th am and 600 mg in the pm to help stabilize mood and will monitor her status  Insomnia Will continue her ambien 5 mg nightly and will monitor   Breast cancer Will continue her letrozole 2.5 mg daily and will monitor   Osteoporosis, unspecified Will continue fosamax 70 mg weekly and calcium twice daily   Allergic rhinitis Will continue flonase daily   Peripheral neuropathy, idiopathic Will stop her neurontin as she states it does not help with her neuropathy and will monitor  Syncope She has had a syncopal episode today during therapy. She states she had this happen about one year ago and was related to blood pressure issues. She also had this prior to her hospitalization will begin her orthostatic vital signs and will follow up in the am and will monitor her status

## 2012-08-20 NOTE — Assessment & Plan Note (Signed)
Will continue fosamax 70 mg weekly and calcium twice daily

## 2012-08-21 ENCOUNTER — Other Ambulatory Visit: Payer: Self-pay

## 2012-08-23 ENCOUNTER — Ambulatory Visit: Payer: Medicare Other | Admitting: Internal Medicine

## 2012-08-24 NOTE — Telephone Encounter (Signed)
Patient is not returning calls

## 2012-08-25 NOTE — Telephone Encounter (Signed)
Left another message for patient to return call.

## 2012-08-26 ENCOUNTER — Encounter: Payer: Self-pay | Admitting: *Deleted

## 2012-08-26 NOTE — Telephone Encounter (Signed)
Patient never returned call, letter mailed to home address on file.

## 2012-08-28 ENCOUNTER — Other Ambulatory Visit: Payer: Self-pay | Admitting: Internal Medicine

## 2012-08-31 ENCOUNTER — Non-Acute Institutional Stay (SKILLED_NURSING_FACILITY): Payer: Medicare Other | Admitting: Adult Health

## 2012-08-31 DIAGNOSIS — I951 Orthostatic hypotension: Secondary | ICD-10-CM

## 2012-09-01 ENCOUNTER — Encounter: Payer: Self-pay | Admitting: Internal Medicine

## 2012-09-20 NOTE — Progress Notes (Signed)
Patient ID: Bianca Gomez, female   DOB: 1927-03-07, 79 y.o.   MRN: [id number]  [facility]  Allergies  Allergen Reactions  . Lithium     Tremors  . Norvasc [Amlodipine Besylate]     Chief Complaint  Patient presents with  . Acute Visit    follow up      HPI: I have been asked to see her for right leg cramps which are severe. She was unable to move her leg due to the tightness in her leg.  After progressive range of motion she was able to move her. She will need therapy to come and continue to work with her legs to prevent them from cramping up. Her ekg does show some changes; she has history of sick sinus syndrome and will a holter monitor placed.   Past Medical History  Diagnosis Date  . Depression   . Hypertension   . Hyperlipidemia   . Thyroid disease   . Cancer 2010    bilateral  . Arthritis   . Obesity   . Pulmonary hypertension 2013  . History of MRSA infection     Past Surgical History  Procedure Laterality Date  . Breast surgery  2010    s/p bilateral mastectomy  . Foot surgery  2006  . Cholecystectomy  1964  . Knee surgery  2003  . Joint replacement Left 2005    hip   . Shoulder surgery  2007  . Phrenic nerve pacemaker implantation  2013  . Skin cancer excision  2012    neck  . Colonoscopy  2003    In [place]  . Pacemaker insertion      Current Outpatient Prescriptions on File Prior to Visit  Medication Sig Dispense Refill  . alendronate (FOSAMAX) 70 MG tablet Take 70 mg by mouth every 7 (seven) days. Take with a full glass of water on an empty stomach.      Marland Kitchen aspirin 81 MG tablet Take 81 mg by mouth daily.        . Calcium Carbonate-Vitamin D (CALCIUM 600-D) 600-400 MG-UNIT per tablet Take 1 tablet by mouth 2 (two) times daily.       . fluticasone (FLONASE) 50 MCG/ACT nasal spray Place 2 sprays into the nose daily.        . folic acid (FOLVITE) 400 MCG tablet Take 400 mcg by mouth daily.        Marland Kitchen HYDROcodone-acetaminophen (NORCO/VICODIN) 5-325 MG  per tablet Take 1 tablet by mouth every 6 (six) hours as needed for pain.  90 tablet  5  . letrozole (FEMARA) 2.5 MG tablet Take 2.5 mg by mouth daily.        . metoprolol tartrate (LOPRESSOR) 25 MG tablet Take 12.5 mg by mouth 2 (two) times daily.      . Oxcarbazepine (TRILEPTAL) 300 MG tablet Take 300 mg by mouth 2 (two) times daily. Take 300 mg in the AM and 600 mg in the PM      . polyethylene glycol (MIRALAX / GLYCOLAX) packet Take 17 g by mouth daily as needed.      . potassium gluconate 595 MG TABS Take 595 mg by mouth daily.      . sertraline (ZOLOFT) 100 MG tablet Take 2 tablets (200 mg total) by mouth daily.  60 tablet  3  . simvastatin (ZOCOR) 40 MG tablet Take 40 mg by mouth every evening.      . torsemide (DEMADEX) 10 MG tablet TAKE 1 TABLET (  10 MG TOTAL) BY MOUTH DAILY.  30 tablet  3  . vitamin C (ASCORBIC ACID) 500 MG tablet Take 500 mg by mouth daily.        Marland Kitchen zolpidem (AMBIEN) 5 MG tablet Take 1 tablet (5 mg total) by mouth at bedtime as needed for sleep.  30 tablet  5   No current facility-administered medications on file prior to visit.    Filed Vitals:   08/20/12 1450  BP: 116/57  Pulse: 62  Height: 5\' 1"  (1.549 m)  Weight: 174 lb (78.926 kg)    SIGNIFICANT DIAGNOSTIC EXAMS   08-09-12: chest x-ray: pulmonary edema; pacemaker device was noted.  08-09-12: ct of head: no acute abnormalities 08-09-12: ekg: nsr; St-T wave abnormalities in the inferolateral leads.  08-10-12: REPEAT ct of head: evolviung right frontal acute ischemic stroke with petechial hemorrhage small 08/2012: 2-4 echo: ef 60-65% mild diastolic dysfunction 08/2012: carotid doppler: no significant stenosis  08-19-12: chest x-ray: poor inspiration; no cardiomegaly; mild to moderate pulmonary vascular congestion; no pleural effusion; no inflammatory consolidate or suspicious nodule. Patchy interstitial findings left mid/lower left lung and right lower lung which could be second ay interstitial edema or  interstitial pneumonitis.  08-20-12: ekg: ventricular rhythm. Nonspecific intraventricular conduction delay anterior mi.   LABS REVIEWED  04-29-12: wbc 8.8; hgb 14.3; hct 42.4 ;mcv 96.5 plt 163; glucose 106; bun 20; creat 0.8; k+4.2;  Na++ 136; tsh 0.74 08-16-12: wbc 6.2; h gb 15.1; hct 45.5; mcv 95.6 ;plt 157; glucose 111; bun 24; creat 0.8; k+3.6 Na++ 138 08-19-12: wbc 8.8;hgb 15.6; hct 43.9; mcv 93; plt 160; glucose 134; bun 23; creat 0.77; k+3.5; na++138 Ca++9.1    Review of Systems  Constitutional: Negative for malaise/fatigue.  Eyes: Negative for blurred vision and double vision.  Respiratory: Negative for cough, shortness of breath and wheezing.   Cardiovascular: Negative for chest pain, palpitations and leg swelling.  Gastrointestinal: Negative for heartburn and abdominal pain.  Musculoskeletal: Negative for myalgias and joint pain.  Skin: Negative.   Neurological: Negative for dizziness, sensory change, focal weakness and headaches.  Psychiatric/Behavioral: The patient is not nervous/anxious and does not have insomnia.     Physical Exam  Constitutional: She is oriented to person, place, and time. She appears well-developed and well-nourished.  Neck: Neck supple. No JVD present. No thyromegaly present.  Cardiovascular: Normal rate, regular rhythm and intact distal pulses.   Respiratory: Effort normal. No respiratory distress. She has no wheezes.  Breath sounds are diminished   GI: Soft. Bowel sounds are normal. She exhibits no distension. There is no tenderness.  Musculoskeletal: Normal range of motion. She exhibits no edema.  Neurological: She is alert and oriented to person, place, and time.  Skin: Skin is warm and dry.  Psychiatric: She has a normal mood and affect.    ASSESSMENT/PLAN  Will have therapy assess and treat her leg pain as indicated. Will setup a 72 hour holter monitor and will continue to monitor her status due to her severe orthostatic hypotension.

## 2012-09-21 ENCOUNTER — Encounter: Payer: Self-pay | Admitting: Adult Health

## 2012-09-21 DIAGNOSIS — I951 Orthostatic hypotension: Secondary | ICD-10-CM | POA: Insufficient documentation

## 2012-09-21 NOTE — Progress Notes (Signed)
Patient ID: Bianca Gomez, female   DOB: 22-Oct-1927, 79 y.o.   MRN: [id number]  [facility]  Allergies  Allergen Reactions  . Lithium     Tremors  . Norvasc [Amlodipine Besylate]      Chief Complaint  Patient presents with  . Acute Visit    follow up visit    HPI: I was asked to follow up on her orthostasis and syncope. She has been on bedrest since her syncopal episode. She has been pulmonology who made no further changes and cardiology who made no further orders as well.  I did perform orthostatic vital signs as follows:  Lying: 150/92: sitting: 137/90; standing: 118/80; she did have significant weakness upon standing; however; this was primarily due to being on bedrest for a significant amount of time.   Past Medical History  Diagnosis Date  . Depression   . Hypertension   . Hyperlipidemia   . Thyroid disease   . Cancer 2010    bilateral  . Arthritis   . Obesity   . Pulmonary hypertension 2013  . History of MRSA infection     Past Surgical History  Procedure Laterality Date  . Breast surgery  2010    s/p bilateral mastectomy  . Foot surgery  2006  . Cholecystectomy  1964  . Knee surgery  2003  . Joint replacement Left 2005    hip   . Shoulder surgery  2007  . Phrenic nerve pacemaker implantation  2013  . Skin cancer excision  2012    neck  . Colonoscopy  2003    In [place]  . Pacemaker insertion      VITAL SIGNS BP 150/92  Pulse 78  Ht 5\' 1"  (1.549 m)  Wt 174 lb (78.926 kg)  BMI 32.89 kg/m2   Patient's Medications  New Prescriptions   No medications on file  Previous Medications   ALENDRONATE (FOSAMAX) 70 MG TABLET    Take 70 mg by mouth every 7 (seven) days. Take with a full glass of water on an empty stomach.   ASPIRIN 81 MG TABLET    Take 81 mg by mouth daily.     CALCIUM CARBONATE-VITAMIN D (CALCIUM 600-D) 600-400 MG-UNIT PER TABLET    Take 1 tablet by mouth 2 (two) times daily.    FLUTICASONE (FLONASE) 50 MCG/ACT NASAL SPRAY    Place 2  sprays into the nose daily.     FOLIC ACID (FOLVITE) 400 MCG TABLET    Take 400 mcg by mouth daily.     HYDROCODONE-ACETAMINOPHEN (NORCO/VICODIN) 5-325 MG PER TABLET    Take 1 tablet by mouth every 6 (six) hours as needed for pain.   LETROZOLE (FEMARA) 2.5 MG TABLET    Take 2.5 mg by mouth daily.     LEVOTHYROXINE (SYNTHROID, LEVOTHROID) 125 MCG TABLET    TAKE 1 TABLET BY MOUTH EVERY DAY   METOPROLOL TARTRATE (LOPRESSOR) 25 MG TABLET    Take 12.5 mg by mouth 2 (two) times daily.   OXCARBAZEPINE (TRILEPTAL) 300 MG TABLET    Take 300 mg by mouth 2 (two) times daily. Take 300 mg in the AM and 600 mg in the PM   POLYETHYLENE GLYCOL (MIRALAX / GLYCOLAX) PACKET    Take 17 g by mouth daily as needed.   POTASSIUM GLUCONATE 595 MG TABS    Take 595 mg by mouth daily.   SERTRALINE (ZOLOFT) 100 MG TABLET    Take 2 tablets (200 mg total) by mouth daily.  SIMVASTATIN (ZOCOR) 40 MG TABLET    Take 40 mg by mouth every evening.   TORSEMIDE (DEMADEX) 10 MG TABLET    TAKE 1 TABLET (10 MG TOTAL) BY MOUTH DAILY.   TORSEMIDE (DEMADEX) 10 MG TABLET    TAKE 1 TABLET (10 MG TOTAL) BY MOUTH DAILY.   VITAMIN C (ASCORBIC ACID) 500 MG TABLET    Take 500 mg by mouth daily.     ZOLPIDEM (AMBIEN) 5 MG TABLET    Take 1 tablet (5 mg total) by mouth at bedtime as needed for sleep.  Modified Medications   No medications on file  Discontinued Medications   No medications on file    SIGNIFICANT DIAGNOSTIC EXAMS   08-09-12: chest x-ray: pulmonary edema; pacemaker device was noted.  08-09-12: ct of head: no acute abnormalities 08-09-12: ekg: nsr; St-T wave abnormalities in the inferolateral leads.  08-10-12: REPEAT ct of head: evolviung right frontal acute ischemic stroke with petechial hemorrhage small 08/2012: 2-4 echo: ef 60-65% mild diastolic dysfunction 08/2012: carotid doppler: no significant stenosis  08-19-12: chest x-ray: poor inspiration; no cardiomegaly; mild to moderate pulmonary vascular congestion; no pleural effusion;  no inflammatory consolidate or suspicious nodule. Patchy interstitial findings left mid/lower left lung and right lower lung which could be second ay interstitial edema or interstitial pneumonitis.  08-20-12: ekg: ventricular rhythm. Nonspecific intraventricular conduction delay anterior mi.   LABS REVIEWED  04-29-12: wbc 8.8; hgb 14.3; hct 42.4 ;mcv 96.5 plt 163; glucose 106; bun 20; creat 0.8; k+4.2;  Na++ 136; tsh 0.74 08-16-12: wbc 6.2; h gb 15.1; hct 45.5; mcv 95.6 ;plt 157; glucose 111; bun 24; creat 0.8; k+3.6 Na++ 138 08-19-12: wbc 8.8;hgb 15.6; hct 43.9; mcv 93; plt 160; glucose 134; bun 23; creat 0.77; k+3.5; na++138 Ca++9.1  08-20-12: ebc 6.8; hgb 14.2; hct 43.7; mcv 96.7; plt 147; glucose 118; bun 18; creat 0.6; k+3.6; Na++147 mag 2.0; cortisol 11.430 08-21-12: cortisol 8 AM: 10.7    Review of Systems  Constitutional: Negative for malaise/fatigue.  Eyes: Negative for blurred vision and double vision.  Respiratory: Negative for cough, shortness of breath and wheezing.   Cardiovascular: Negative for chest pain, palpitations and leg swelling.  Gastrointestinal: Negative for heartburn and abdominal pain.  Musculoskeletal: Negative for myalgias and joint pain.  Skin: Negative.   Neurological: Negative for dizziness, sensory change, focal weakness and headaches.  Psychiatric/Behavioral: The patient is not nervous/anxious and does not have insomnia.     Physical Exam  Constitutional: She is oriented to person, place, and time. She appears well-developed and well-nourished.  Neck: Neck supple. No JVD present. No thyromegaly present.  Cardiovascular: Normal rate, regular rhythm and intact distal pulses.   Respiratory: Effort normal. No respiratory distress. She has no wheezes.  Breath sounds are diminished   GI: Soft. Bowel sounds are normal. She exhibits no distension. There is no tenderness.  Musculoskeletal: Normal range of motion. She exhibits no edema. she has bilateral lower  extremity weakness due to being in bed.  Neurological: She is alert and oriented to person, place, and time.  Skin: Skin is warm and dry.  Psychiatric: She has a normal mood and affect. she is alert and oriented.         ASSESSMENT/ PLAN:  Orthostatic hypotension At this time she is stable I will remove her bedrest restriction and restart her on active therapy. Will continue to monitor her status; we are awaiting her cardiology consult report at this time. Her cortisol levels have returned back normal.

## 2012-09-21 NOTE — Assessment & Plan Note (Signed)
At this time she is stable I will remove her bedrest restriction and restart her on active therapy. Will continue to monitor her status; we are awaiting her cardiology consult report at this time. Her cortisol levels have returned back normal.

## 2012-09-24 ENCOUNTER — Non-Acute Institutional Stay (SKILLED_NURSING_FACILITY): Payer: Medicare Other | Admitting: Nurse Practitioner

## 2012-09-24 ENCOUNTER — Non-Acute Institutional Stay: Payer: BC Managed Care – PPO | Admitting: Nurse Practitioner

## 2012-09-24 ENCOUNTER — Encounter: Payer: Self-pay | Admitting: Internal Medicine

## 2012-09-24 DIAGNOSIS — I509 Heart failure, unspecified: Secondary | ICD-10-CM

## 2012-09-24 DIAGNOSIS — R55 Syncope and collapse: Secondary | ICD-10-CM

## 2012-09-24 DIAGNOSIS — I639 Cerebral infarction, unspecified: Secondary | ICD-10-CM

## 2012-09-24 DIAGNOSIS — I6350 Cerebral infarction due to unspecified occlusion or stenosis of unspecified cerebral artery: Secondary | ICD-10-CM

## 2012-09-24 DIAGNOSIS — R531 Weakness: Secondary | ICD-10-CM

## 2012-09-24 DIAGNOSIS — I951 Orthostatic hypotension: Secondary | ICD-10-CM

## 2012-09-24 DIAGNOSIS — J8410 Pulmonary fibrosis, unspecified: Secondary | ICD-10-CM

## 2012-09-24 DIAGNOSIS — I5022 Chronic systolic (congestive) heart failure: Secondary | ICD-10-CM

## 2012-09-24 DIAGNOSIS — I10 Essential (primary) hypertension: Secondary | ICD-10-CM

## 2012-09-24 NOTE — Progress Notes (Signed)
doro 09/24/2012  MRN: [id number] Name: Bianca Gomez  Sex: female Age: 79 y.o. DOB: August 05, 1927  Facility/Room: [facility]  Provider: Zachery Dauer  Code Status: DNR  Allergies: Lithium and Norvasc  MEDICATIONS:   Flonase, ii puffs per nare once a day Folic Acid 40 one a day Synthroid 125 mcg once a day Trileptal 300 mg each a.m. Trileptal 300 mg, ii tabs at 9 pm Zoloft 100 mg, ii tabs at bedtime Zolpidem 5 mg at bedtime Calcium 500 +D one tablet twice a day Toprol XL ER, 25 mg, 1/2 tablet twice a day Vitamin C 500 mg each day Aspirin 81 mg each day Fosamax 70 mg i tab each week, take with 7-8 ounces of water, before breakfast Potassium Gluconate 595 mg i tab each day  Femara 2.5 mg each day Zocor 40 mg each day Miralax 17 gm in 4-8 ounces of liquid daily as needed for constipation Norco 5/325 i tablet every 6 hours as needed for pain.  Do not exceed 3 gm of tylenol per day.     Chief Complaint:  Pt states she is feeling well and is able to sit, stand, and walk without syncope.    HPI: Patient is 79 y.o. female who is preparing for discharge on 09/25/2012,  She was originally admitted s/p CVA, and noted to have severe sycope with sitting and standing.  Pt received extensive PT/OT, and now is ready to go home.     Medical History  Past Medical History  Diagnosis Date  . Depression   . Hypertension   . Hyperlipidemia   . Thyroid disease   . Cancer 2010    bilateral  . Arthritis   . Obesity   . Pulmonary hypertension 2013  . History of MRSA infection     Review of Systems  DATA OBTAINED: from patient, nurse, medical record, family member  General:  Generally feeling well, no significant changes in status SKIN: No itching, rash or wounds EYES: No eye pain, redness, discharge EARS: No earache, tinnitus, change in hearing NOSE: No congestion, drainage or bleeding  MOUTH/THROAT: No mouth or tooth pain, No sore throat, No difficulty chewing or swallowing   RESPIRATORY: No cough, wheezing, SOB CARDIAC: No chest pain, palpitations, lower extremity edema  GI: No abdominal pain, No N/V/D or constipation, No heartburn or reflux  GU: No dysuria, frequency or urgency, or incontinence  MUSCULOSKELETAL: No unrelieved bone/joint pain NEUROLOGIC: Awake, alert, appropriate to situation, No change in mental status.  Moves all four, no focal deficits PSYCHIATRIC: No overt anxiety or sadness. Sleeps well. No behavior issue.      Lab Results: From 08/19/2012 CBC WBC 8.8 RBC 4.72 HGB 15.6 HCT 43.9 MCV 93 MCH 33 MCHC 35.4 RDW 13.2 Platelet 160 Neutrophil % 73.7 Lymphocyte 13.8 Monocyte 10.5 Eosinophil 1.4 Basophil 0.6 Neutrophil # 6.4 Lymphocyte 1.2 Monocyte 0.9 Eosinophil 0.1 Basophil 0.1  Renal Panel: Glucose 134 BUN 23 Creatinine 0.77 Sodium 138 Potassium 3.5 Chloride 99 CO2 34 Calcium total 9.1 Osmolality 281 Anion gap 5 EGFR> 60  Chest x-ray showed pulmonary congestion, but no pneumonitis ECG showed LVH     Vital Signs:   Physical Exam  GENERAL APPEARANCE: Alert, conversant. Appropriately groomed. No acute distress.  SKIN: No diaphoresis rash, or wounds HEAD: Normocephalic, atraumatic  EYES: Conjunctiva/lids clear. Pupils round, reactive. EOMs intact.  EARS: External exam WNL, canals clear. Hearing grossly normal.  NOSE: No deformity or discharge.  MOUTH/THROAT: Lips w/o lesions. Mouth and throat normal.  Tongue moist, w/o lesion.  NECK: No thyroid tenderness, enlargement or nodule.   ADENOPATHY:  No palpable cervical, supraclavicular, axillary, or femoral adenopathy RESPIRATORY: Breathing is even, unlabored. Lung sounds are clear   CARDIOVASCULAR: Heart RRR no murmurs, rubs or gallops. No peripheral edema. Palpable posterior tibial, pedal dorsalis, popliteal, and femoral pulses. VENOUS: No varicosities. No venous stasis skin changes  GASTROINTESTINAL: Abdomen is soft, non-tender, not distended w/ normal bowel  sounds. No mass, ventral or inguinal hernia. No organomegally GENITOURINARY: Bladder non tender, not distended  MUSCULOSKELETAL: No abnormal joints or musculature NEUROLOGIC: Oriented X3.  Independent movement of all four extremities PSYCHIATRIC: Mood and affect appropriate to situation, no behavioral issues    There are no diagnoses linked to this encounter.   Cari Caraway, FNP-C u This encounter was created in error - please disregard. This encounter was created in error - please disregard. This encounter was created in error - please disregard. This encounter was created in error - please disregard.

## 2012-09-30 ENCOUNTER — Ambulatory Visit (INDEPENDENT_AMBULATORY_CARE_PROVIDER_SITE_OTHER): Payer: Medicare Other | Admitting: Internal Medicine

## 2012-09-30 ENCOUNTER — Encounter: Payer: Self-pay | Admitting: Internal Medicine

## 2012-09-30 VITALS — BP 122/86 | HR 66 | Temp 98.1°F | Wt 181.0 lb

## 2012-09-30 DIAGNOSIS — E039 Hypothyroidism, unspecified: Secondary | ICD-10-CM

## 2012-09-30 DIAGNOSIS — I6350 Cerebral infarction due to unspecified occlusion or stenosis of unspecified cerebral artery: Secondary | ICD-10-CM

## 2012-09-30 DIAGNOSIS — E785 Hyperlipidemia, unspecified: Secondary | ICD-10-CM

## 2012-09-30 DIAGNOSIS — I10 Essential (primary) hypertension: Secondary | ICD-10-CM

## 2012-09-30 DIAGNOSIS — I639 Cerebral infarction, unspecified: Secondary | ICD-10-CM

## 2012-09-30 DIAGNOSIS — I272 Pulmonary hypertension, unspecified: Secondary | ICD-10-CM

## 2012-09-30 DIAGNOSIS — G4700 Insomnia, unspecified: Secondary | ICD-10-CM

## 2012-09-30 DIAGNOSIS — M810 Age-related osteoporosis without current pathological fracture: Secondary | ICD-10-CM

## 2012-09-30 DIAGNOSIS — Z23 Encounter for immunization: Secondary | ICD-10-CM

## 2012-09-30 DIAGNOSIS — I2789 Other specified pulmonary heart diseases: Secondary | ICD-10-CM

## 2012-09-30 DIAGNOSIS — J8410 Pulmonary fibrosis, unspecified: Secondary | ICD-10-CM

## 2012-09-30 LAB — CBC WITH DIFFERENTIAL/PLATELET
Eosinophils Relative: 2.4 % (ref 0.0–5.0)
HCT: 36.3 % (ref 36.0–46.0)
Monocytes Relative: 8.3 % (ref 3.0–12.0)
Neutrophils Relative %: 76.1 % (ref 43.0–77.0)
Platelets: 167 10*3/uL (ref 150.0–400.0)
WBC: 6.4 10*3/uL (ref 4.5–10.5)

## 2012-09-30 LAB — COMPREHENSIVE METABOLIC PANEL
Albumin: 3.7 g/dL (ref 3.5–5.2)
Alkaline Phosphatase: 63 U/L (ref 39–117)
BUN: 13 mg/dL (ref 6–23)
Calcium: 9.2 mg/dL (ref 8.4–10.5)
Creatinine, Ser: 0.6 mg/dL (ref 0.4–1.2)
Glucose, Bld: 101 mg/dL — ABNORMAL HIGH (ref 70–99)
Potassium: 4.4 mEq/L (ref 3.5–5.1)

## 2012-09-30 MED ORDER — DENOSUMAB 60 MG/ML ~~LOC~~ SOLN
60.0000 mg | Freq: Once | SUBCUTANEOUS | Status: AC
  Administered 2012-09-30: 60 mg via SUBCUTANEOUS

## 2012-09-30 MED ORDER — LETROZOLE 2.5 MG PO TABS: 2.5000 mg | ORAL_TABLET | Freq: Every day | ORAL | Status: AC

## 2012-09-30 MED ORDER — OXCARBAZEPINE 300 MG PO TABS: 300.0000 mg | ORAL_TABLET | Freq: Two times a day (BID) | ORAL | Status: DC

## 2012-09-30 MED ORDER — ZOLPIDEM TARTRATE 5 MG PO TABS: 5.0000 mg | ORAL_TABLET | Freq: Every evening | ORAL | Status: AC | PRN

## 2012-09-30 MED ORDER — SIMVASTATIN 40 MG PO TABS: 40.0000 mg | ORAL_TABLET | Freq: Every evening | ORAL | Status: AC

## 2012-09-30 NOTE — Assessment & Plan Note (Signed)
Symptoms well controlled with prn ambien. Will continue.

## 2012-09-30 NOTE — Progress Notes (Signed)
Subjective:    Patient ID: Bianca Gomez, female    DOB: 08-05-1927, 79 y.o.   MRN: [id number]  HPI 79YO female with h/o pulmonary hypertension, pulmonary fibrosis, hypothyroidism, bipolar disorder, osteoporosis, s/p recent hospitalization for CVA presents for follow up. She recently completed rehab at [facility]. She reports that strength is gradually improving. She notes several medication changes made during hospitalization and rehab. Losartan and Hydralazine were stopped. Toprol was started at rehab, but she discontinued it this week. She denies chest pain, palpitations. She has chronic dyspnea, made worse by minimal exertion. She continues to 2.5L of oxygen by [place]. She notes that she recently saw Dr. Meredeth Ide and he discontinued her Demadex because of some intermittent hypotension.  She would like to stop her fosamax and change to Prolia. This was recommended to her by her oncologist. She reports that she has gotten insurance approval for this.  Outpatient Encounter Prescriptions as of 09/30/2012  Medication Sig Dispense Refill  . aspirin 81 MG tablet Take 81 mg by mouth daily.        . fluticasone (FLONASE) 50 MCG/ACT nasal spray Place 2 sprays into the nose daily.        Marland Kitchen HYDROcodone-acetaminophen (NORCO/VICODIN) 5-325 MG per tablet Take 1 tablet by mouth every 6 (six) hours as needed for pain.  90 tablet  5  . letrozole (FEMARA) 2.5 MG tablet Take 1 tablet (2.5 mg total) by mouth daily.  30 tablet  6  . levothyroxine (SYNTHROID, LEVOTHROID) 125 MCG tablet TAKE 1 TABLET BY MOUTH EVERY DAY  30 tablet  8  . Oxcarbazepine (TRILEPTAL) 300 MG tablet Take 1 tablet (300 mg total) by mouth 2 (two) times daily. Take 300 mg in the AM and 600 mg in the PM  90 tablet  6  . polyethylene glycol (MIRALAX / GLYCOLAX) packet Take 17 g by mouth daily as needed.      . potassium gluconate 595 MG TABS Take 595 mg by mouth daily.      . sertraline (ZOLOFT) 100 MG tablet Take 2 tablets (200 mg total) by mouth  daily.  60 tablet  3  . simvastatin (ZOCOR) 40 MG tablet Take 1 tablet (40 mg total) by mouth every evening.  30 tablet  6  . zolpidem (AMBIEN) 5 MG tablet Take 1 tablet (5 mg total) by mouth at bedtime as needed for sleep.  30 tablet  5   No facility-administered encounter medications on file as of 09/30/2012.    Review of Systems  Constitutional: Positive for fatigue. Negative for fever, chills, appetite change and unexpected weight change.  HENT: Negative for ear pain, congestion, sore throat, trouble swallowing, neck pain, voice change and sinus pressure.   Eyes: Negative for visual disturbance.  Respiratory: Positive for shortness of breath. Negative for cough, wheezing and stridor.   Cardiovascular: Negative for chest pain, palpitations and leg swelling.  Gastrointestinal: Negative for nausea, vomiting, abdominal pain, diarrhea, constipation, blood in stool, abdominal distention and anal bleeding.  Genitourinary: Negative for dysuria and flank pain.  Musculoskeletal: Positive for gait problem. Negative for myalgias and arthralgias.  Skin: Negative for color change and rash.  Neurological: Positive for weakness (generalized). Negative for dizziness and headaches.  Hematological: Negative for adenopathy. Does not bruise/bleed easily.  Psychiatric/Behavioral: Negative for suicidal ideas, sleep disturbance and dysphoric mood. The patient is not nervous/anxious.        Objective:   Physical Exam  Constitutional: She is oriented to person, place, and time. She  appears well-developed and well-nourished. No distress.  On Supplemental oxygen 2.5L by [place], walks with Bianca Gomez  HENT:  Head: Normocephalic and atraumatic.  Right Ear: External ear normal.  Left Ear: External ear normal.  Nose: Nose normal.  Mouth/Throat: Oropharynx is clear and moist. No oropharyngeal exudate.  Eyes: Conjunctivae are normal. Pupils are equal, round, and reactive to light. Right eye exhibits no discharge. Left eye  exhibits no discharge. No scleral icterus.  Neck: Normal range of motion. Neck supple. No tracheal deviation present. No thyromegaly present.  Cardiovascular: Normal rate, regular rhythm, normal heart sounds and intact distal pulses.  Exam reveals no gallop and no friction rub.   No murmur heard. Pulmonary/Chest: Effort normal. No accessory muscle usage. Not tachypneic. No respiratory distress. She has no decreased breath sounds. She has no wheezes. She has no rhonchi. She has rales (few scattered). She exhibits no tenderness.  Musculoskeletal: Normal range of motion. She exhibits no edema and no tenderness.  Lymphadenopathy:    She has no cervical adenopathy.  Neurological: She is alert and oriented to person, place, and time. No cranial nerve deficit. She exhibits normal muscle tone. Coordination normal.  Skin: Skin is warm and dry. No rash noted. She is not diaphoretic. No erythema. No pallor.  Psychiatric: She has a normal mood and affect. Her behavior is normal. Judgment and thought content normal.          Assessment & Plan:

## 2012-09-30 NOTE — Assessment & Plan Note (Signed)
Long history of osteoporosis on Fosamax. Pt oncologist had recommended change to Prolia. Pt reports insurance approved this. Will start today. Discussed need for adequate calcium intake in the diet. Will check Vit D with labs today.

## 2012-09-30 NOTE — Assessment & Plan Note (Signed)
Symptomatically doing well. Continues on supplemental oxygen 24/7. Will continue to follow with Dr. Meredeth Ide at [facility].

## 2012-09-30 NOTE — Assessment & Plan Note (Signed)
Recently started on Simvastatin. Will check LFTs with labs today.

## 2012-09-30 NOTE — Assessment & Plan Note (Signed)
Followed by Dr. Meredeth Ide. Symptomatically stable. Demedex recently stopped because of hypotension. Will monitor. Continue supplemental oxygen by [place].

## 2012-09-30 NOTE — Assessment & Plan Note (Signed)
Will check TSH with labs today. Continue Levothyroxine. 

## 2012-09-30 NOTE — Assessment & Plan Note (Signed)
BP Readings from Last 3 Encounters:  09/30/12 122/86  08/31/12 150/92  08/20/12 116/57   BP well controlled at present without medication. Pt was previously on Losartan and hydralazine, however these were stopped during recent hospitalization. She was placed on Toprol, but has not been taking it last week. Will monitor BP for now.

## 2012-09-30 NOTE — Assessment & Plan Note (Signed)
S/p recent hospitalization for CVA. Doing well. Completed rehab at [facility]. Plans for PT at home now. Will continue statin and aspirin.

## 2012-10-01 LAB — VITAMIN D 25 HYDROXY (VIT D DEFICIENCY, FRACTURES): Vit D, 25-Hydroxy: 44 ng/mL (ref 30–89)

## 2012-10-08 ENCOUNTER — Telehealth: Payer: Self-pay | Admitting: Internal Medicine

## 2012-10-08 NOTE — Telephone Encounter (Signed)
Please check in with home health nurse next week to make sure things going better.

## 2012-10-08 NOTE — Telephone Encounter (Signed)
Dian Queen from [facility] left 2 messages stating she would for patient to have nursing and a Child psychotherapist sent to her home. When she went out today, Bianca Gomez BP was 160/102 and she was real anxious and short of breath but her O2 stats remained over 90%. Please call her back on her cell phone at [phone] or office phone at [phone]

## 2012-10-08 NOTE — Telephone Encounter (Signed)
[facility] wants an order called into them for this patient in order to get a nurse out this weekend # [phone]

## 2012-10-08 NOTE — Telephone Encounter (Signed)
Called and gave Kriste Basque a verbal order for this.

## 2012-10-11 ENCOUNTER — Other Ambulatory Visit: Payer: Self-pay | Admitting: Specialist

## 2012-10-14 ENCOUNTER — Inpatient Hospital Stay: Payer: Self-pay | Admitting: Internal Medicine

## 2012-10-14 LAB — COMPREHENSIVE METABOLIC PANEL
Albumin: 3 g/dL — ABNORMAL LOW (ref 3.4–5.0)
Alkaline Phosphatase: 107 U/L (ref 50–136)
Anion Gap: 6 — ABNORMAL LOW (ref 7–16)
BUN: 10 mg/dL (ref 7–18)
Co2: 26 mmol/L (ref 21–32)
Creatinine: 0.71 mg/dL (ref 0.60–1.30)
Glucose: 160 mg/dL — ABNORMAL HIGH (ref 65–99)
Osmolality: 271 (ref 275–301)
Potassium: 3.8 mmol/L (ref 3.5–5.1)
SGPT (ALT): 40 U/L (ref 12–78)
Total Protein: 7.4 g/dL (ref 6.4–8.2)

## 2012-10-14 LAB — CK TOTAL AND CKMB (NOT AT ARMC)
CK, Total: 50 U/L (ref 21–215)
CK, Total: 42 U/L (ref 21–215)
CK-MB: 2.5 ng/mL (ref 0.5–3.6)

## 2012-10-14 LAB — CBC
HCT: 36.5 % (ref 35.0–47.0)
MCH: 31.7 pg (ref 26.0–34.0)
MCHC: 33.7 g/dL (ref 32.0–36.0)
Platelet: 212 10*3/uL (ref 150–440)
WBC: 10.9 10*3/uL (ref 3.6–11.0)

## 2012-10-14 LAB — URINALYSIS, COMPLETE
Bacteria: NONE SEEN
Blood: NEGATIVE
Glucose,UR: NEGATIVE mg/dL (ref 0–75)
Ketone: NEGATIVE
Leukocyte Esterase: NEGATIVE
Nitrite: NEGATIVE
Protein: NEGATIVE
RBC,UR: <1 /HPF (ref 0–5)
Specific Gravity: 1.005 (ref 1.003–1.030)
Squamous Epithelial: NONE SEEN
WBC UR: NONE SEEN /HPF (ref 0–5)

## 2012-10-14 LAB — PRO B NATRIURETIC PEPTIDE: B-Type Natriuretic Peptide: 19806 pg/mL — ABNORMAL HIGH (ref 0–450)

## 2012-10-14 LAB — TROPONIN I: Troponin-I: 0.05 ng/mL

## 2012-10-14 NOTE — Telephone Encounter (Signed)
Spoke with Larita Fife, she state patient is not doing very well according to the nurse that goes out. She did have an appointment to see Dr. Meredeth Ide and thinks he may admit her to the hospital. She is extremely short of breath to the point she can barely function. She did allow the nurse to come out to see her but therapy did not go out there this week.

## 2012-10-15 LAB — BASIC METABOLIC PANEL
Anion Gap: 5 — ABNORMAL LOW (ref 7–16)
BUN: 12 mg/dL (ref 7–18)
Calcium, Total: 7.9 mg/dL — ABNORMAL LOW (ref 8.5–10.1)
Chloride: 100 mmol/L (ref 98–107)
Creatinine: 0.7 mg/dL (ref 0.60–1.30)
EGFR (African American): >60
EGFR (Non-African Amer.): >60
Potassium: 3.9 mmol/L (ref 3.5–5.1)
Sodium: 134 mmol/L — ABNORMAL LOW (ref 136–145)

## 2012-10-15 LAB — CBC WITH DIFFERENTIAL/PLATELET
Basophil #: 0 10*3/uL (ref 0.0–0.1)
Basophil %: 0.2 %
Eosinophil #: 0 10*3/uL (ref 0.0–0.7)
Eosinophil %: 0.3 %
HCT: 33.6 % — ABNORMAL LOW (ref 35.0–47.0)
HGB: 11.5 g/dL — ABNORMAL LOW (ref 12.0–16.0)
Lymphocyte #: 0.4 10*3/uL — ABNORMAL LOW (ref 1.0–3.6)
MCH: 31.7 pg (ref 26.0–34.0)
MCHC: 34.2 g/dL (ref 32.0–36.0)
MCV: 93 fL (ref 80–100)
Monocyte #: 0.7 x10 3/mm (ref 0.2–0.9)
Monocyte %: 5.8 %
Neutrophil #: 10.2 10*3/uL — ABNORMAL HIGH (ref 1.4–6.5)
Neutrophil %: 89.9 %
RBC: 3.63 10*6/uL — ABNORMAL LOW (ref 3.80–5.20)
RDW: 15.4 % — ABNORMAL HIGH (ref 11.5–14.5)
WBC: 11.4 10*3/uL — ABNORMAL HIGH (ref 3.6–11.0)

## 2012-10-15 LAB — TROPONIN I: Troponin-I: 0.05 ng/mL

## 2012-10-15 LAB — CK TOTAL AND CKMB (NOT AT ARMC): CK-MB: 1.5 ng/mL (ref 0.5–3.6)

## 2012-10-15 NOTE — Telephone Encounter (Signed)
I did try calling patient to see how she was feeling I did leave a message.

## 2012-10-18 ENCOUNTER — Ambulatory Visit: Payer: Self-pay | Admitting: Internal Medicine

## 2012-10-18 LAB — BASIC METABOLIC PANEL
Anion Gap: 5 — ABNORMAL LOW (ref 7–16)
BUN: 23 mg/dL — ABNORMAL HIGH (ref 7–18)
Calcium, Total: 9.1 mg/dL (ref 8.5–10.1)
Chloride: 98 mmol/L (ref 98–107)
Creatinine: 0.76 mg/dL (ref 0.60–1.30)
Glucose: 122 mg/dL — ABNORMAL HIGH (ref 65–99)
Osmolality: 273 (ref 275–301)
Sodium: 134 mmol/L — ABNORMAL LOW (ref 136–145)

## 2012-10-18 LAB — CBC WITH DIFFERENTIAL/PLATELET
Basophil #: 0 10*3/uL (ref 0.0–0.1)
Eosinophil #: 0.1 10*3/uL (ref 0.0–0.7)
HCT: 39.7 % (ref 35.0–47.0)
HGB: 13.3 g/dL (ref 12.0–16.0)
MCH: 31.1 pg (ref 26.0–34.0)
MCV: 93 fL (ref 80–100)
Monocyte #: 0.8 x10 3/mm (ref 0.2–0.9)
Monocyte %: 6.2 %
Neutrophil %: 87.6 %
Platelet: 251 10*3/uL (ref 150–440)
RBC: 4.28 10*6/uL (ref 3.80–5.20)
RDW: 15.6 % — ABNORMAL HIGH (ref 11.5–14.5)
WBC: 13.1 10*3/uL — ABNORMAL HIGH (ref 3.6–11.0)

## 2012-10-18 LAB — MAGNESIUM: Magnesium: 1.7 mg/dL — ABNORMAL LOW

## 2012-10-19 LAB — CREATININE, SERUM
Creatinine: 0.67 mg/dL (ref 0.60–1.30)
EGFR (African American): >60
EGFR (Non-African Amer.): >60

## 2012-10-19 LAB — CULTURE, BLOOD (SINGLE)

## 2012-11-06 ENCOUNTER — Ambulatory Visit: Payer: Self-pay | Admitting: Internal Medicine

## 2012-11-09 ENCOUNTER — Encounter: Payer: Self-pay | Admitting: *Deleted

## 2012-11-10 ENCOUNTER — Encounter: Payer: Self-pay | Admitting: Internal Medicine

## 2012-11-10 ENCOUNTER — Ambulatory Visit (INDEPENDENT_AMBULATORY_CARE_PROVIDER_SITE_OTHER): Payer: Medicare Other | Admitting: Internal Medicine

## 2012-11-10 VITALS — BP 118/80 | HR 86 | Temp 98.7°F

## 2012-11-10 DIAGNOSIS — Z515 Encounter for palliative care: Secondary | ICD-10-CM

## 2012-11-10 DIAGNOSIS — J8410 Pulmonary fibrosis, unspecified: Secondary | ICD-10-CM

## 2012-11-10 DIAGNOSIS — I509 Heart failure, unspecified: Secondary | ICD-10-CM

## 2012-11-10 LAB — BASIC METABOLIC PANEL
BUN: 13 mg/dL (ref 6–23)
CO2: 31 mEq/L (ref 19–32)
Calcium: 9.4 mg/dL (ref 8.4–10.5)
Chloride: 94 mEq/L — ABNORMAL LOW (ref 96–112)
Creatinine, Ser: 0.6 mg/dL (ref 0.4–1.2)
Glucose, Bld: 102 mg/dL — ABNORMAL HIGH (ref 70–99)
Potassium: 3.5 mEq/L (ref 3.5–5.1)

## 2012-11-10 NOTE — Assessment & Plan Note (Signed)
Progressive pulmonary fibrosis. S/p recent admission for decompensated CHF and progressive dyspnea from pulmonary fibrosis. Pt is now under hospice care. Doing well. Using supplemental oxygen 24/7 and occasional morphine for air hunger. Will continue hospice care.

## 2012-11-10 NOTE — Progress Notes (Signed)
Subjective:    Patient ID: Bianca Gomez, female    DOB: 06/05/1927, 79 y.o.   MRN: [id number]  HPI 79YO female with CHF, pulmonary fibrosis, pulmonary hypertension, presents for follow up after recent hospitalization for CHF decompensation. She is now under hospice care at home. Friends are providing assistance with food, and she receives some meals from [employer] Whe[employer]. She continues to have dyspnea at rest, however this is improved compared to previous. She has been using supplemental oxygen 24/7 and prn morphine for air hunger. She is clear that she does NOT want to return to the ED or hospital for additional care. She has DNR form on her fridge and friends and family are aware of her wishes. She denies any concerns today.  Outpatient Encounter Prescriptions as of 11/10/2012  Medication Sig  . ALPRAZolam (XANAX) 0.25 MG tablet Take 0.25 mg by mouth 3 (three) times daily as needed for anxiety (Every 6 hours ans needed for anxiety or shortness of breath).  Marland Kitchen aspirin 81 MG tablet Take 81 mg by mouth daily.    . fluticasone (FLONASE) 50 MCG/ACT nasal spray Place 2 sprays into the nose daily.    . furosemide (LASIX) 20 MG tablet Take 20 mg by mouth 2 (two) times daily. Take 3 tablets (60 mg) two times a day  . letrozole (FEMARA) 2.5 MG tablet Take 1 tablet (2.5 mg total) by mouth daily.  Marland Kitchen levothyroxine (SYNTHROID, LEVOTHROID) 125 MCG tablet TAKE 1 TABLET BY MOUTH EVERY DAY  . morphine (ROXANOL) 20 MG/ML concentrated solution Take by mouth every 4 (four) hours as needed for severe pain (Take 0.25 mL every 4 hours as needed for pain and or shortness of breath).  . Oxcarbazepine (TRILEPTAL) 300 MG tablet Take 1 tablet (300 mg total) by mouth 2 (two) times daily. Take 300 mg in the AM and 600 mg in the PM  . potassium chloride (KLOR-CON) 20 MEQ packet Take by mouth 2 (two) times daily.  . predniSONE (STERAPRED UNI-PAK) 10 MG tablet Take by mouth daily. Began at 60 mg tapering by 10 mg a day until to  zero  . senna (SENOKOT) 8.6 MG TABS tablet Take 1 tablet by mouth daily as needed for mild constipation (Take 1-2 tablets twice a day).  . sertraline (ZOLOFT) 100 MG tablet Take 2 tablets (200 mg total) by mouth daily.  Marland Kitchen zolpidem (AMBIEN) 5 MG tablet Take 1 tablet (5 mg total) by mouth at bedtime as needed for sleep.  Marland Kitchen HYDROcodone-acetaminophen (NORCO/VICODIN) 5-325 MG per tablet Take 1 tablet by mouth every 6 (six) hours as needed for pain.  . polyethylene glycol (MIRALAX / GLYCOLAX) packet Take 17 g by mouth daily as needed.  . potassium gluconate 595 MG TABS Take 595 mg by mouth daily.  . simvastatin (ZOCOR) 40 MG tablet Take 1 tablet (40 mg total) by mouth every evening.   BP 118/80  Pulse 86  Temp(Src) 98.7 F (37.1 C) (Oral)  SpO2 97%  Review of Systems  Constitutional: Positive for fatigue. Negative for fever and chills.  Respiratory: Positive for cough, shortness of breath and wheezing (intermittent). Negative for chest tightness and stridor.   Cardiovascular: Negative for chest pain and leg swelling.  Gastrointestinal: Negative for abdominal pain.  Musculoskeletal: Positive for arthralgias and myalgias.  Neurological: Positive for weakness.       Objective:   Physical Exam  Constitutional: She is oriented to person, place, and time. She appears well-developed and well-nourished. No distress.  HENT:  Head: Normocephalic and atraumatic.  Right Ear: External ear normal.  Left Ear: External ear normal.  Nose: Nose normal.  Mouth/Throat: Oropharynx is clear and moist. No oropharyngeal exudate.  Eyes: Conjunctivae are normal. Pupils are equal, round, and reactive to light. Right eye exhibits no discharge. Left eye exhibits no discharge. No scleral icterus.  Neck: Normal range of motion. Neck supple. No tracheal deviation present. No thyromegaly present.  Cardiovascular: Normal rate, regular rhythm, normal heart sounds and intact distal pulses.  Frequent extrasystoles are  present. Exam reveals no gallop and no friction rub.   No murmur heard. Pulmonary/Chest: Breath sounds normal. Accessory muscle usage present. Tachypnea noted. No respiratory distress. She has no decreased breath sounds. She has no wheezes. She has no rhonchi. She has no rales. She exhibits no tenderness.  Air movement good. No rhonchi, rales, or wheezing noted.  Musculoskeletal: Normal range of motion. She exhibits no edema and no tenderness.  Lymphadenopathy:    She has no cervical adenopathy.  Neurological: She is alert and oriented to person, place, and time. No cranial nerve deficit. She exhibits normal muscle tone. Coordination normal.  Skin: Skin is warm and dry. No rash noted. She is not diaphoretic. No erythema. No pallor.  Psychiatric: She has a normal mood and affect. Her behavior is normal. Judgment and thought content normal.          Assessment & Plan:  Over of which >50% spent in face-to-face contact with patient discussing plan of care

## 2012-11-10 NOTE — Assessment & Plan Note (Signed)
Pt has started on home hospice care. She is clear about her wishes NOT to return to the ED for repeat admissions. She is doing well with hospice care at home, using oxygen supplementation and morphine prn air hunger. Her friends are providing additional support and are in agreement with her wishes. She has DNR in place on refrigerator at home. She will email or call with any questions or concerns.

## 2012-11-10 NOTE — Progress Notes (Signed)
Pre-visit discussion using our clinic review tool. No additional management support is needed unless otherwise documented below in the visit note.  

## 2012-11-10 NOTE — Assessment & Plan Note (Signed)
Recent episode of decompensated CHF, requiring hospitalization and IV diuretics. Appears euvolemic today. Will continue Furosemide and potassium. Recheck BMP with labs today. Continue home Hospice care.

## 2012-11-18 ENCOUNTER — Ambulatory Visit: Payer: Medicare Other | Admitting: General Surgery

## 2012-11-25 NOTE — Progress Notes (Signed)
Patient ID: Bianca Gomez, female   DOB: [birth year], 79 y.o.   MRN: [id number]     MRN: [id number]  Name: Bianca Gomez  Sex: female  Age: 79 y.o.  Code Status:  DNR  Date of Visit 09/24/2012   Allergies: Lithium and Norvasc   Chief Complaint  Patient presents with  . Discharge Note     HPI: Patient is 79 y.o. female who is preparing for discharge on 09/25/2012, She was originally admitted s/p CVA, and noted to have severe sycope with sitting and standing.  Pt received extensive PT/OT, and now is ready to go home.     Medical History   Past Medical History   Diagnosis  Date   .  Depression    .  Hypertension    .  Hyperlipidemia    .  Thyroid disease    .  Cancer  2010     bilateral   .  Arthritis    .  Obesity    .  Pulmonary hypertension  2013   .  History of MRSA infection      Past Surgical History  Procedure Laterality Date  . Breast surgery  2010    s/p bilateral mastectomy  . Foot surgery  2006  . Cholecystectomy  1964  . Knee surgery  2003  . Joint replacement Left 2005    hip   . Shoulder surgery  2007  . Phrenic nerve pacemaker implantation  2013  . Skin cancer excision  2012    neck  . Colonoscopy  2003    In [place]  . Pacemaker insertion     rgies: Lithium and Norvasc   MEDICATIONS:  Flonase, ii puffs per nare once a day  Folic Acid 40 one a day  Synthroid 125 mcg once a day  Trileptal 300 mg each a.m.  Trileptal 300 mg, ii tabs at 9 pm  Zoloft 100 mg, ii tabs at bedtime  Zolpidem 5 mg at bedtime  Calcium 500 +D one tablet twice a day  Toprol XL ER, 25 mg, 1/2 tablet twice a day  Vitamin C 500 mg each day  Aspirin 81 mg each day  Fosamax 70 mg 1 tab each week, take with 7-8 ounces of water, before breakfast  Potassium Gluconate 595 mg i tab each day  Femara 2.5 mg each day  Zocor 40 mg each day  Miralax 17 gm in 4-8 ounces of liquid daily as needed for constipation  Norco 5/325 i tablet every 6 hours as needed for pain. Do  not exceed 3 gm of tylenol per day.   Review of Systems: DATA OBTAINED: from patient, nurse, medical record, family member  General: Generally feeling well, no significant changes in status  SKIN: No itching, rash or wounds  EYES: No eye pain, redness, discharge  EARS: No earache, tinnitus, change in hearing  NOSE: No congestion, drainage or bleeding  MOUTH/THROAT: No mouth or tooth pain, No sore throat, No difficulty chewing or swallowing  RESPIRATORY: No cough, wheezing, SOB  CARDIAC: No chest pain, palpitations, lower extremity edema  GI: No abdominal pain, No N/V/D or constipation, No heartburn or reflux  GU: No dysuria, frequency or urgency, or incontinence  MUSCULOSKELETAL: No unrelieved bone/joint pain  NEUROLOGIC: Awake, alert, appropriate to situation, No change in mental status.  Moves all four, no focal deficits  PSYCHIATRIC: No overt anxiety or sadness. Sleeps well. No behavior issue.   Lab Results: From 08/19/2012  CBC  WBC 8.8  RBC 4.72  HGB 15.6  HCT 43.9  MCV 93  MCH 33  MCHC 35.4  RDW 13.2  Platelet 160  Neutrophil % 73.7  Lymphocyte 13.8  Monocyte 10.5  Eosinophil 1.4  Basophil 0.6  Neutrophil # 6.4  Lymphocyte 1.2  Monocyte 0.9  Eosinophil 0.1  Basophil 0.1  Renal Panel:  Glucose 134  BUN 23  Creatinine 0.77  Sodium 138  Potassium 3.5  Chloride 99  CO2 26  Vital  Signs:  BP 132/80, Pulse 82 T 97.8 RR 18  Physical Exam  GENERAL APPEARANCE: Alert, conversant. Appropriately groomed. No acute distress.  SKIN: No diaphoresis rash, or wounds  HEAD: Normocephalic, atraumatic  EYES: Conjunctiva/lids clear. Pupils round, reactive. EOMs intact.  EARS: External exam WNL, canals clear. Hearing grossly normal.  NOSE: No deformity or discharge.  MOUTH/THROAT: Lips w/o lesions. Mouth and throat normal. Tongue moist, w/o lesion.  NECK: No thyroid tenderness, enlargement or nodule.  ADENOPATHY: No palpable cervical, supraclavicular, axillary, or femoral  adenopathy  RESPIRATORY: Breathing is even, unlabored. Lung sounds are clear  CARDIOVASCULAR: Heart RRR no murmurs, rubs or gallops. No peripheral edema. Palpable posterior tibial, pedal dorsalis, popliteal, and femoral pulses.  VENOUS: No varicosities. No venous stasis skin changes  GASTROINTESTINAL: Abdomen is soft, non-tender, not distended w/ normal bowel sounds. No mass, ventral or inguinal hernia. No organomegally  GENITOURINARY: Bladder non tender, not distended  MUSCULOSKELETAL: No abnormal joints or musculature  NEUROLOGIC: Oriented X3. Independent movement of all four extremities  PSYCHIATRIC: Mood and affect appropriate to situation, no behavioral issues   Assessment/Plan:   Pt to be d/c'ed home with ongoing PT/OT services to further mobility, strengthening, safety, and adaptation for ADL's.    Congestive Heart Failure:  Continue the Toprol XL 25 mg, 1/2 tab twice a day  Hypothyroidism, continue the synthroid 125 mcg per day  Supplementation, continue the Calcium 500 + D twice a day, Vitamin C 500 mg per day, Potassium Gluconate 595 mg per day, and folic acid 0.4 mg per day  Osteoporosis, continue the fosamax 70 mg 1 tab each week, on an empty stomach with 8 ounces of water.  Depression continue the Zoloft 100 mg at bedtime  Constipation, continue the Miralax 17 gm in fluid of choice once a day as needed for constipation  Coronary artery disease, continue the ASA 81 mg per day and Zocor 40 mg each evening.    Pain, have written for a limited amount of Norco 5/325, 1 tablet every 6 hours as needed for pain.    Pt will be followed by her PCP and other providers after d/c from facility.

## 2012-12-20 NOTE — Progress Notes (Signed)
Visit Date 09/24/12 [facility], Rm [id number]  Patient ID: Bianca Gomez, female   DOB: 1927-05-05, 79 y.o.   MRN: [id number]    Chief Complaint  Patient presents with  . Discharge Note  patient for discharge on 09/25/2012      HPI:   79 y/o female patient was admitted to the hospital from 08/10/12- 08/13/12 with drowsiness, slurred speech followed by LOC. She was admiited for syncopal and possible CVA workup. First ct head did not reveal anything. Repeat ct head in 24 hrs showed right frontal acute stroke with small petechial hemorrhage.   Has had severe orthostatic hypotension, unable to tolerate sitting, with syncope, now much better.   Has responded well to physical therapy  Past Medical History  Diagnosis Date  . Depression   . Hypertension   . Hyperlipidemia   . Thyroid disease   . Cancer 2010    bilateral  . Arthritis   . Obesity   . Pulmonary hypertension 2013  . History of MRSA infection     Past Surgical History  Procedure Laterality Date  . Breast surgery  2010    s/p bilateral mastectomy  . Foot surgery  2006  . Cholecystectomy  1964  . Knee surgery  2003  . Joint replacement Left 2005    hip   . Shoulder surgery  2007  . Phrenic nerve pacemaker implantation  2013  . Skin cancer excision  2012    neck  . Colonoscopy  2003    In [place]  . Pacemaker insertion       Allergies  Allergen Reactions  . Lithium     Tremors  . Norvasc [Amlodipine Besylate]     Current Outpatient Prescriptions on File Prior to Visit  Medication Sig Dispense Refill  . aspirin 81 MG tablet Take 81 mg by mouth daily.        . fluticasone (FLONASE) 50 MCG/ACT nasal spray Place 2 sprays into the nose daily.        Marland Kitchen HYDROcodone-acetaminophen (NORCO/VICODIN) 5-325 MG per tablet Take 1 tablet by mouth every 6 (six) hours as needed for pain.  90 tablet  5  . levothyroxine (SYNTHROID, LEVOTHROID) 125 MCG tablet TAKE 1 TABLET BY MOUTH EVERY DAY  30 tablet  8  . polyethylene glycol  (MIRALAX / GLYCOLAX) packet Take 17 g by mouth daily as needed.      . potassium gluconate 595 MG TABS Take 595 mg by mouth daily.      . sertraline (ZOLOFT) 100 MG tablet Take 2 tablets (200 mg total) by mouth daily.  60 tablet  3   No current facility-administered medications on file prior to visit.   Additional medications include: Femara 2.5 mg per day Fosamax 70 mg per week, on an empty stomach Metoprolol 12.5 mg twice a day Trileptal 300 mg twice a day Torsemide 30 mg each day Vitamin C 500 mg each day  Review of Systems  Constitutional: Positive for malaise/fatigue.  HENT: Negative for ear discharge and nosebleeds.   Eyes: Negative for discharge and redness.  Cardiovascular: Negative for chest pain, claudication and leg swelling.  Gastrointestinal: Positive for vomiting. Negative for nausea.  Genitourinary: Negative for hematuria and flank pain.  Musculoskeletal: Positive for myalgias. Negative for joint pain.  Skin: Negative for itching and rash.  Neurological: Positive for weakness. Negative for seizures and loss of consciousness.  Psychiatric/Behavioral: Negative for suicidal ideas, hallucinations and substance abuse.  SIGNIFICANT DIAGNOSTIC EXAMS 08-09-12: chest x-ray: pulmonary edema; pacemaker device was noted.   08-09-12: ct of head: no acute abnormalities 08-09-12: ekg: nsr; St-T wave abnormalities in the inferolateral leads.   08-10-12: REPEAT ct of head: evolviung right frontal acute ischemic stroke with petechial hemorrhage small 08/2012: 2-4 echo: ef 60-65% mild diastolic dysfunction 08/2012: carotid doppler: no significant stenosis     LABS REVIEWED   04-29-12: wbc 8.8; hgb 14.3; hct 42.4 ;mcv 96.5 plt 163; glucose 106; bun 20; creat 0.8; k+4.2;   Na++ 136; tsh 0.74 08-16-12: wbc 6.2; h gb 15.1; hct 45.5; mcv 95.6 ;plt 157; glucose 111; bun 24; creat 0.8; k+3.6 Na++ 138  BP 127/79  Pulse 60  Temp(Src) 97.9 F (36.6 C)  SpO2 99% Physical Exam    Constitutional: She is oriented to person, place, and time. She appears well-developed and well-nourished.  HENT:  Head: Normocephalic and atraumatic.  Left Ear: External ear normal.  Mouth/Throat: Oropharynx is clear and moist. No oropharyngeal exudate.  Some cerumen noted in the right ear, canal not obstructed  Patient's teeth are in excellent repair, no oral lesions evidence.  Eyes: Conjunctivae and EOM are normal. Pupils are equal, round, and reactive to light. Right eye exhibits no discharge. Left eye exhibits no discharge. No scleral icterus.  Neck: No thyromegaly present.  Carotid bruits not appreciated.  Cardiovascular: Normal rate.   Intermittently irregular  Pulmonary/Chest: Effort normal and breath sounds normal. No respiratory distress. She has no wheezes. She has no rales. She exhibits no tenderness.  Breath sounds in entirely completely clear. Good air movement noted in all lung fields.  Abdominal: Soft. Bowel sounds are normal. She exhibits no distension. There is no tenderness. There is no rebound and no guarding.  Musculoskeletal: She exhibits no edema and no tenderness.  Lymphadenopathy:    She has no cervical adenopathy.  Neurological: She is alert and oriented to person, place, and time.  Skin: No rash noted. No erythema. No pallor.  Psychiatric: She has a normal mood and affect. Her behavior is normal. Judgment and thought content normal.    Assessment/plan  Patient is being discharged on 09/25/2012, and will need skilled services in the home including physical and occupational therapy. Purpose for mobility, strengthening, and safety in the home.  Status post CVA, we'll certainly continue her aspirin at 81 mg a day and her Zocor at 40 mg per day.  Hyperlipidemia certainly continue the Zocor 40 mg per day  Adrenal insufficiency, no treatment at this time, we'll need ongoing monitoring by her primary care provider.  Hypertension, metoprolol 12.5 mg and will need  ongoing monitoring.  Depression, Zoloft 100 mg in the evening, and Trileptal 300 mg in the morning and 600 mg in the evening and will need ongoing monitoring.  Cardiac arrhythmia/congestive heart failure, will need her Demadex 30 mg a day and continue her potassium gluconate at 595 mg per day  Hypothyroidism, we'll certainly continue her levothyroxine at 125 mcg per day, she will need ongoing monitoring.  Allergic rhinitis, continue Flonase, 2 puffs per nares per day.  Insomnia, will continue the Ambien at 5 mg and will need monitoring.

## 2012-12-22 ENCOUNTER — Ambulatory Visit (INDEPENDENT_AMBULATORY_CARE_PROVIDER_SITE_OTHER): Payer: BC Managed Care – PPO | Admitting: General Surgery

## 2012-12-22 ENCOUNTER — Encounter: Payer: Self-pay | Admitting: General Surgery

## 2012-12-22 VITALS — BP 124/78 | HR 82 | Resp 18 | Wt 168.0 lb

## 2012-12-22 DIAGNOSIS — Z853 Personal history of malignant neoplasm of breast: Secondary | ICD-10-CM

## 2012-12-22 NOTE — Progress Notes (Signed)
Patient ID: Bianca Gomez, female   DOB: Mar 07, 1927, 79 y.o.   MRN: [id number]  Chief Complaint  Patient presents with  . Follow-up    1 year follow up breast. History of bilateral mastectomy    HPI Bianca Gomez is a 79 y.o. female who presents for a breast evaluation. The patient has a history of bilateral mastectomy performed in 2010. The patient denies any problems in the breast area at this time. She had a stroke in August 2014. She has since then been hospitalized for heart failure and shortness of breath.  HPI  Past Medical History  Diagnosis Date  . Depression   . Hypertension   . Hyperlipidemia   . Thyroid disease   . Cancer 2010    bilateral  . Arthritis   . Obesity   . Pulmonary hypertension 2013  . History of MRSA infection   . Stroke 2014    Past Surgical History  Procedure Laterality Date  . Breast surgery  2010    s/p bilateral mastectomy  . Foot surgery  2006  . Cholecystectomy  1964  . Knee surgery  2003  . Joint replacement Left 2005    hip   . Shoulder surgery  2007  . Phrenic nerve pacemaker implantation  2013  . Skin cancer excision  2012    neck  . Colonoscopy  2003    In [place]  . Pacemaker insertion      Family History  Problem Relation Age of Onset  . Cancer Other     unknown family member with breast cancer    Social History History  Substance Use Topics  . Smoking status: Never Smoker   . Smokeless tobacco: Never Used  . Alcohol Use: No    Allergies  Allergen Reactions  . Lithium     Tremors  . Norvasc [Amlodipine Besylate]     Current Outpatient Prescriptions  Medication Sig Dispense Refill  . ALPRAZolam (XANAX) 0.25 MG tablet Take 0.25 mg by mouth 3 (three) times daily as needed for anxiety (Every 6 hours ans needed for anxiety or shortness of breath).      Marland Kitchen aspirin 81 MG tablet Take 81 mg by mouth daily.        . fluticasone (FLONASE) 50 MCG/ACT nasal spray Place 2 sprays into the nose daily.        . furosemide  (LASIX) 20 MG tablet Take 20 mg by mouth 2 (two) times daily. Take 3 tablets (60 mg) two times a day      . HYDROcodone-acetaminophen (NORCO/VICODIN) 5-325 MG per tablet Take 1 tablet by mouth every 6 (six) hours as needed for pain.  90 tablet  5  . letrozole (FEMARA) 2.5 MG tablet Take 1 tablet (2.5 mg total) by mouth daily.  30 tablet  6  . levothyroxine (SYNTHROID, LEVOTHROID) 125 MCG tablet TAKE 1 TABLET BY MOUTH EVERY DAY  30 tablet  8  . morphine (ROXANOL) 20 MG/ML concentrated solution Take by mouth every 4 (four) hours as needed for severe pain (Take 0.25 mL every 4 hours as needed for pain and or shortness of breath).      . Oxcarbazepine (TRILEPTAL) 300 MG tablet Take 1 tablet (300 mg total) by mouth 2 (two) times daily. Take 300 mg in the AM and 600 mg in the PM  90 tablet  6  . polyethylene glycol (MIRALAX / GLYCOLAX) packet Take 17 g by mouth daily as needed.      Marland Kitchen  potassium chloride (KLOR-CON) 20 MEQ packet Take by mouth 2 (two) times daily.      . potassium gluconate 595 MG TABS Take 595 mg by mouth daily.      . predniSONE (STERAPRED UNI-PAK) 10 MG tablet Take by mouth daily. Began at 60 mg tapering by 10 mg a day until to zero      . senna (SENOKOT) 8.6 MG TABS tablet Take 1 tablet by mouth daily as needed for mild constipation (Take 1-2 tablets twice a day).      . sertraline (ZOLOFT) 100 MG tablet Take 2 tablets (200 mg total) by mouth daily.  60 tablet  3  . simvastatin (ZOCOR) 40 MG tablet Take 1 tablet (40 mg total) by mouth every evening.  30 tablet  6  . zolpidem (AMBIEN) 5 MG tablet Take 1 tablet (5 mg total) by mouth at bedtime as needed for sleep.  30 tablet  5   No current facility-administered medications for this visit.    Review of Systems Review of Systems  Constitutional: Negative.   Respiratory: Negative.   Cardiovascular: Negative.     Blood pressure 124/78, pulse 82, resp. rate 18, weight 168 lb (76.204 kg), SpO2 96.00%.  Physical Exam Physical Exam   Constitutional: She is oriented to person, place, and time. She appears well-developed and well-nourished.  Neck: No thyromegaly present.  Cardiovascular: Normal rate, regular rhythm and normal heart sounds.   No murmur heard. No lower extremity edema is evident.  Pulmonary/Chest: Effort normal and breath sounds normal.  Lymphadenopathy:    She has no cervical adenopathy.  Neurological: She is alert and oriented to person, place, and time.  Skin: Skin is warm and dry.     Assessment    No evidence of breast malignancy recurrence.  Clear cardiopulmonary examination, normal oximetry on oxygen supplementation.    Plan    Repeat clinical exam in one year.       Earline Mayotte 12/25/2012, 11:14 AM

## 2012-12-22 NOTE — Patient Instructions (Signed)
Patient to return in 1 year for breast follow up.

## 2013-01-13 NOTE — Progress Notes (Signed)
This encounter was created in error - please disregard.

## 2013-01-13 NOTE — Addendum Note (Signed)
Addended by: Boyce Medici on: 01/13/2013 12:08 AM   Modules accepted: Level of Service, SmartSet

## 2013-01-17 ENCOUNTER — Encounter: Payer: Self-pay | Admitting: General Surgery

## 2013-01-21 DIAGNOSIS — I2789 Other specified pulmonary heart diseases: Secondary | ICD-10-CM

## 2013-01-21 DIAGNOSIS — R5381 Other malaise: Secondary | ICD-10-CM

## 2013-01-21 DIAGNOSIS — J8410 Pulmonary fibrosis, unspecified: Secondary | ICD-10-CM

## 2013-01-21 DIAGNOSIS — I38 Endocarditis, valve unspecified: Secondary | ICD-10-CM

## 2013-01-31 ENCOUNTER — Telehealth: Payer: Self-pay | Admitting: *Deleted

## 2013-01-31 DIAGNOSIS — E2740 Unspecified adrenocortical insufficiency: Secondary | ICD-10-CM

## 2013-01-31 MED ORDER — PREDNISONE 10 MG PO TABS: 10.0000 mg | ORAL_TABLET | Freq: Every day | ORAL | Status: AC

## 2013-01-31 MED ORDER — MORPHINE SULFATE (CONCENTRATE) 20 MG/ML PO SOLN: ORAL | Status: DC

## 2013-01-31 NOTE — Telephone Encounter (Signed)
Left message on Bianca Gomez voicemail informing her prescriptions has been sent to the pharmacy per her request.

## 2013-01-31 NOTE — Telephone Encounter (Signed)
Fine to call in Morphine as below. Please also call in Prednisone 10mg  daily #5

## 2013-01-31 NOTE — Telephone Encounter (Signed)
Bianca Gomez left a message requesting refill for liquid Morphine 20 mg per 1 mL (20mg /mL) taking .25 to 1 mL every 1-2 hours for increased shortness of breath and crackling in her lungs. Also would you consider adding Prednisone to help with tightness in chest. [facility] [address]

## 2013-02-14 ENCOUNTER — Ambulatory Visit: Payer: Medicare Other | Admitting: Internal Medicine

## 2013-03-17 ENCOUNTER — Telehealth: Payer: Self-pay | Admitting: Internal Medicine

## 2013-03-17 NOTE — Telephone Encounter (Signed)
error 

## 2013-03-17 NOTE — Telephone Encounter (Signed)
email has been sent requesting prolia authorization from insurance.

## 2013-03-17 NOTE — Telephone Encounter (Signed)
The patient is requesting a prolia vaccine when she comes in on 4.1.15.

## 2013-03-24 NOTE — Telephone Encounter (Signed)
Ok, please make patient aware when this has been approved.

## 2013-03-28 ENCOUNTER — Other Ambulatory Visit: Payer: Self-pay | Admitting: Internal Medicine

## 2013-04-01 NOTE — Telephone Encounter (Signed)
Is she continuing on hospice care? I don't think that they will allow Prolia coverage while she is under hospice.

## 2013-04-01 NOTE — Telephone Encounter (Signed)
She is still on hospice care

## 2013-04-01 NOTE — Telephone Encounter (Signed)
I did not receive anything in reference to this, Dr. Gilford Rile do you recall receiving anything?

## 2013-04-01 NOTE — Telephone Encounter (Signed)
Have received message from [employer] (Prolia authorization).  States more clinical information is needed for prior authorization for secondary insurance.  Per Larene Beach, staff msg from [place] forwarded to Dr. Gilford Rile.  Per Larene Beach, a letter is to be written by provider for the information needed.  Awaiting letter from Dr. Gilford Rile, will then fax to [facility] to complete the process.

## 2013-04-01 NOTE — Telephone Encounter (Signed)
Can you let her know that hospice does not cover Prolia?

## 2013-04-01 NOTE — Telephone Encounter (Signed)
We will need to ask pt what other medication she has tried for osteoporosis and what reactions she has had, so that I can write a letter.

## 2013-04-01 NOTE — Telephone Encounter (Signed)
Called and spoke with patient, she stated she tried Fosamax but did not have a reaction to it she just would forget to take it.

## 2013-04-04 NOTE — Telephone Encounter (Signed)
FYI to Dr. Walker 

## 2013-04-04 NOTE — Telephone Encounter (Signed)
Pt called to cancel appt 4/1, states she was told she could not receive Prolia so does not want to keep the f/u appt.  Advised pt I would confirm that she is unable to receive Prolia.

## 2013-04-06 ENCOUNTER — Ambulatory Visit: Payer: Medicare Other | Admitting: Internal Medicine

## 2013-04-12 NOTE — Telephone Encounter (Signed)
Staff msg sent to [facility] regarding Prolia.  If the patient can get coverage, I have asked her to let us know so that we can schedule.

## 2013-04-13 NOTE — Telephone Encounter (Signed)
Ok,noted

## 2013-06-01 ENCOUNTER — Telehealth: Payer: Self-pay | Admitting: *Deleted

## 2013-06-01 ENCOUNTER — Other Ambulatory Visit: Payer: Self-pay | Admitting: *Deleted

## 2013-06-01 MED ORDER — PREDNISONE 10 MG PO TABS: 10.0000 mg | ORAL_TABLET | Freq: Every day | ORAL | Status: DC

## 2013-06-01 MED ORDER — MORPHINE SULFATE ER 15 MG PO TBCR: 15.0000 mg | EXTENDED_RELEASE_TABLET | Freq: Every day | ORAL | Status: DC

## 2013-06-01 NOTE — Telephone Encounter (Signed)
Vickie from hospice states that pt is currently on liquid morphine, .35ml every 4 hrs for sob.  Vickie is requesting an order for ER morphine and a daily prednisone for her shortness of breath

## 2013-06-01 NOTE — Telephone Encounter (Signed)
Per Dr. Gilford Rile, since 10mg  MS Contin is unavailable okay to send rx for 15mg 

## 2013-06-01 NOTE — Telephone Encounter (Signed)
Fine to start MS Contin 10mg  daily #30 and Prednisone 10mg  daily #30.

## 2013-06-09 ENCOUNTER — Telehealth: Payer: Self-pay | Admitting: *Deleted

## 2013-06-09 MED ORDER — PREDNISONE 10 MG PO TABS: 10.0000 mg | ORAL_TABLET | Freq: Every day | ORAL | Status: DC

## 2013-06-09 MED ORDER — MORPHINE SULFATE ER 15 MG PO TBCR: 15.0000 mg | EXTENDED_RELEASE_TABLET | Freq: Every day | ORAL | Status: DC

## 2013-06-09 NOTE — Telephone Encounter (Signed)
Sent Rx to pharmacy  

## 2013-06-09 NOTE — Telephone Encounter (Signed)
Hospice nurse, vickie, called stating that the morphine that you prescribed is working well, occasional oral dose is also being given.  Nurse states that she is filling the medication cart on Monday and is requesting [facility]  She is also asking if you would like for her to continue on the prednisone daily.

## 2013-06-09 NOTE — Telephone Encounter (Signed)
Yes, continue Prednisone daily and fine to send in necessary refills.

## 2013-06-22 ENCOUNTER — Other Ambulatory Visit: Payer: Self-pay | Admitting: *Deleted

## 2013-06-22 ENCOUNTER — Telehealth: Payer: Self-pay | Admitting: *Deleted

## 2013-06-22 MED ORDER — MORPHINE SULFATE ER 15 MG PO TBCR: 15.0000 mg | EXTENDED_RELEASE_TABLET | Freq: Every day | ORAL | Status: DC

## 2013-06-22 NOTE — Telephone Encounter (Signed)
Faxed Rx and notified Vickie

## 2013-06-22 NOTE — Telephone Encounter (Signed)
Bianca Gomez called requesting Morphine Sulfate 15mg  12HR #15 once daily.  Last refill 6.4.15 for #30 however pt is a Hospice pt and Rx can only be for #15 at a time. [facility] [address] [facility] write [facility] Pt on Rx.  Please advise refill

## 2013-06-22 NOTE — Telephone Encounter (Signed)
Fine to print and send in

## 2013-06-28 ENCOUNTER — Telehealth: Payer: Self-pay | Admitting: *Deleted

## 2013-06-28 MED ORDER — MORPHINE SULFATE (CONCENTRATE) 20 MG/ML PO SOLN: ORAL | Status: DC

## 2013-06-28 NOTE — Telephone Encounter (Signed)
Bianca Gomez from Hospice called requesting refill on liquid Morphine.  She is [facility] [address]  Please advise refill in Dr Derry Skill absense

## 2013-06-28 NOTE — Telephone Encounter (Signed)
rx ok'd for refill.  rx printed and placed at your desk.

## 2013-06-28 NOTE — Telephone Encounter (Signed)
rx faxed as requested

## 2013-06-29 ENCOUNTER — Telehealth: Payer: Self-pay | Admitting: *Deleted

## 2013-06-29 NOTE — Telephone Encounter (Signed)
Crystal from Hospice called requesting pts Morphine 15mg  12HR Rx be changed to BID instead of once daily.  Further states pt is having increased shortness of breath.  A new Rx will be needed with additional pills if change is authorized.  Please advise refill

## 2013-06-29 NOTE — Telephone Encounter (Signed)
Spoke with Crystal from Hospice.  She will contact Dr Nyra Capes at [facility] for evaluation.

## 2013-06-29 NOTE — Telephone Encounter (Signed)
I sent for the original message, which was asking about increasing Morphine to bid. Looks like hospice MD will handle.

## 2013-06-29 NOTE — Telephone Encounter (Signed)
Crystal called back again states pt has a productive cough with wheezing, she is requesting an antibiotic.

## 2013-06-29 NOTE — Telephone Encounter (Signed)
This was sent to me.  Do you need my help ?

## 2013-06-29 NOTE — Telephone Encounter (Signed)
She needs to be evaluated. I know that she is under hospice care, however she has heart failure and may be having a heart failure exacerbation. Perhaps the MD associated with hospice can evaluate her? Or we can order a CXR portable?

## 2013-06-29 NOTE — Telephone Encounter (Signed)
Dr. Thomes Dinning patient, ok?

## 2013-07-11 ENCOUNTER — Telehealth: Payer: Self-pay | Admitting: *Deleted

## 2013-07-11 MED ORDER — MORPHINE SULFATE ER 15 MG PO TBCR: 15.0000 mg | EXTENDED_RELEASE_TABLET | Freq: Every day | ORAL | Status: DC

## 2013-07-11 NOTE — Telephone Encounter (Signed)
Fine to fill. 

## 2013-07-11 NOTE — Telephone Encounter (Signed)
Spoke with Vickie, advised Rx being faxed.

## 2013-07-11 NOTE — Telephone Encounter (Signed)
Vickie from Hospice called requesting Morphine Sulfate XR 15mg .  [facility] [address].   Please advise

## 2013-07-18 ENCOUNTER — Other Ambulatory Visit: Payer: Self-pay | Admitting: Internal Medicine

## 2013-07-25 ENCOUNTER — Telehealth: Payer: Self-pay | Admitting: *Deleted

## 2013-07-25 MED ORDER — MORPHINE SULFATE ER 15 MG PO TBCR: 15.0000 mg | EXTENDED_RELEASE_TABLET | Freq: Every day | ORAL | Status: DC

## 2013-07-25 NOTE — Telephone Encounter (Signed)
Spoke to [place] need.  rx written for MS contin 15mg  (#15 with no refills).  rx signed and placed on your desk.

## 2013-07-25 NOTE — Telephone Encounter (Signed)
Crystal from Hospice called requesting Morphine Sulfate 15mg  refill.  Please fax Rx to pharmacy and write Hospice Patient on it.  Please advise in Dr Derry Skill absence

## 2013-07-26 NOTE — Telephone Encounter (Signed)
Rx faxed to pharmacy as requested.

## 2013-08-02 IMAGING — CR DG CHEST 1V PORT
1 series · 1 of 1 positions shown · non-contrast
Comparison: none

REASON FOR EXAM: shortness of breath
COMMENTS:

[portable]
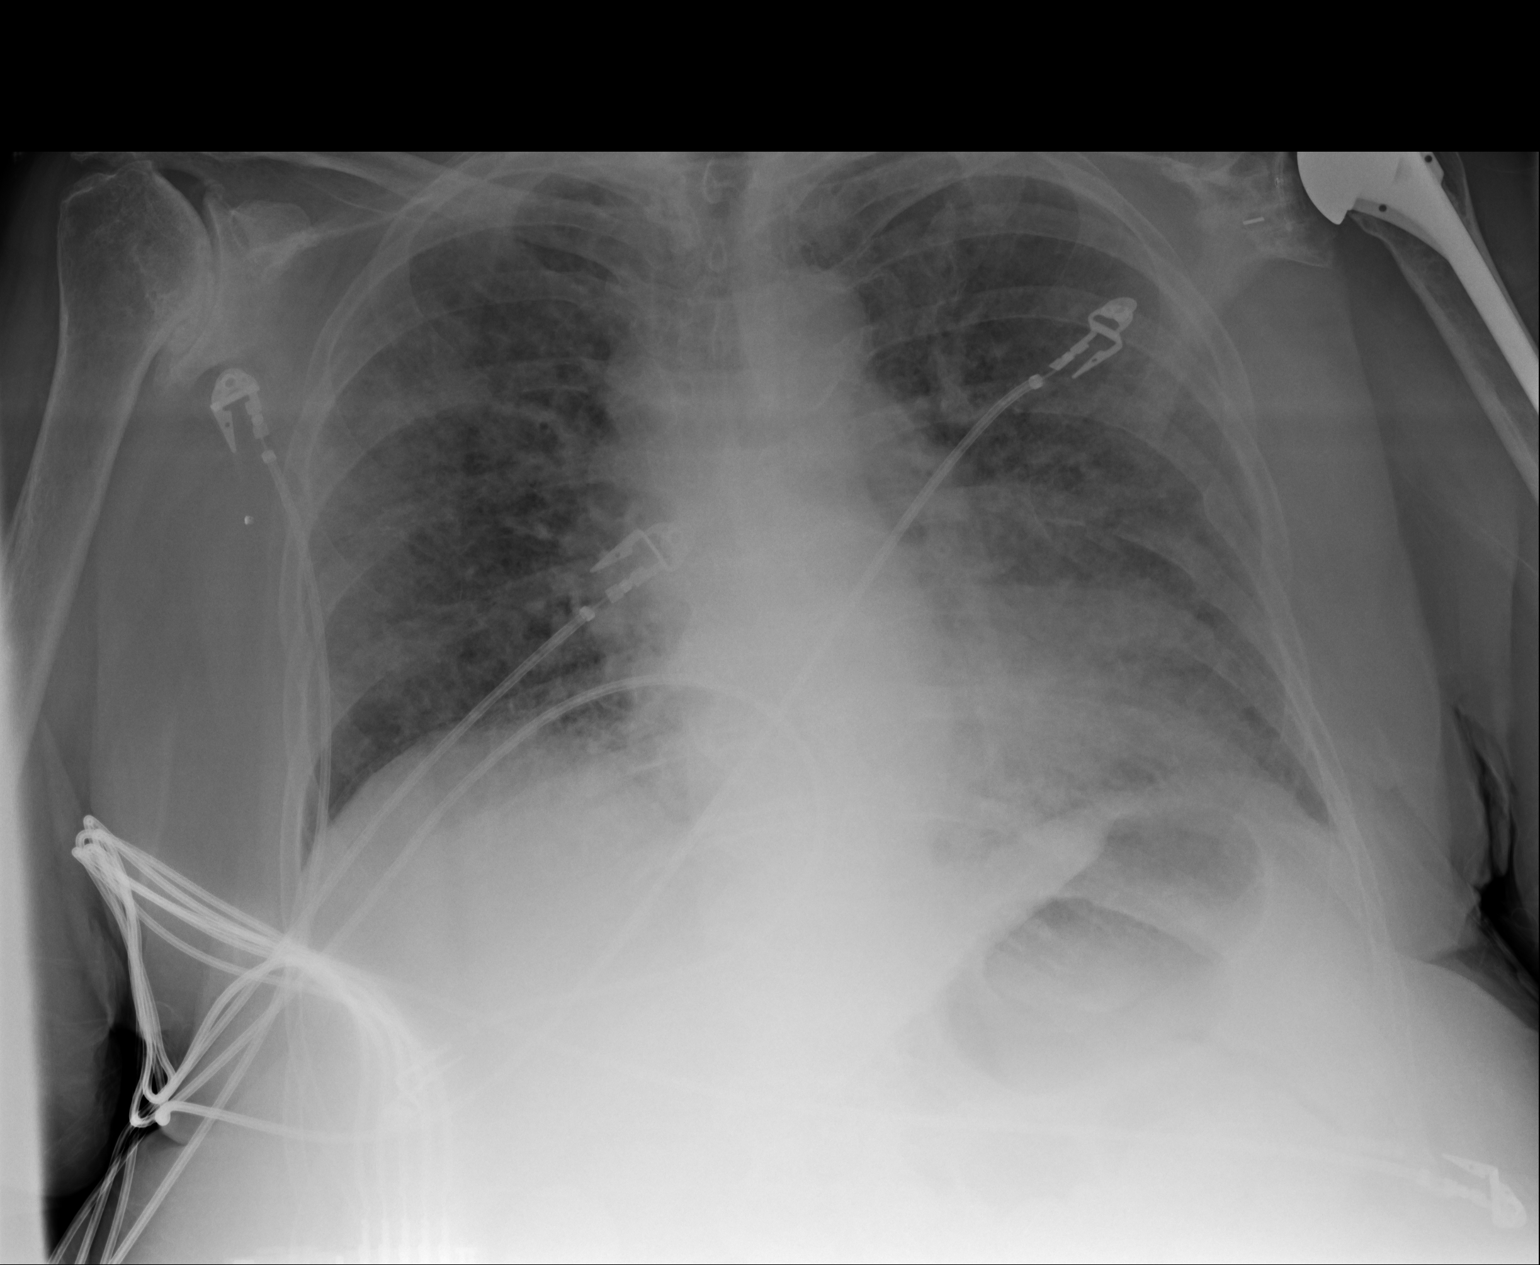

[1 of 1 positions shown; findings below may reference images not displayed]

PROCEDURE:     DXR - DXR PORTABLE CHEST SINGLE VIEW  - May 18, 2011  [DATE]

RESULT:     Comparison is made to study 23 April, 2011.

The heart silhouette is difficult to define given the poor differentiation
of the left heart border which could be secondary to lingular atelectasis or
infiltrate or respiratory motion and cardiac motion artifact. There is
evidence of mild diffuse interstitial edema without a large effusion. Left
shoulder hemiarthroplasty changes are present. Cardiac monitoring electrodes
are present.
IMPRESSION: 1. Pulmonary edema. Mild cardiomegaly is suggested but the left heart border
is poorly seen raising the possibility of motion artifact or even
atelectasis. Infiltrate is felt to be less likely.

[REDACTED]

## 2013-08-02 IMAGING — CT CT EXTREM LOW W/O CM*R*
2 of 4 series · 16 of 46 positions shown, 19 images · non-contrast
Comparison: none

REASON FOR EXAM: R thigh pain/weakness.  Evaluate for hematoma.
COMMENTS:

[Series 3: pelvis · axial · 0.56mm/px · z∈[-1310,-857]mm · 13 of 168 slices shown, 16 images]
[im 11/168  soft-tissue]
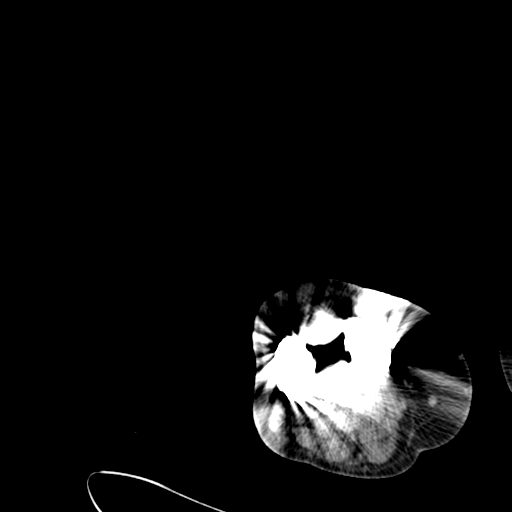
[im 11/168  bone]
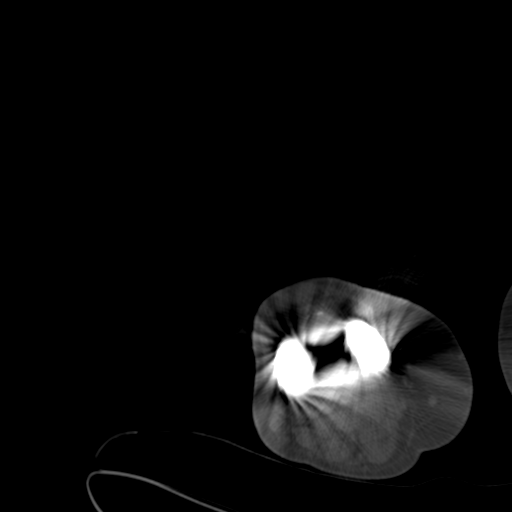
[im 27/168  soft-tissue]
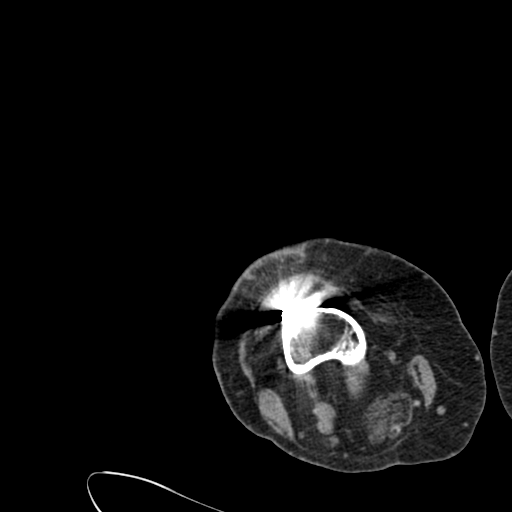
[im 44/168  soft-tissue]
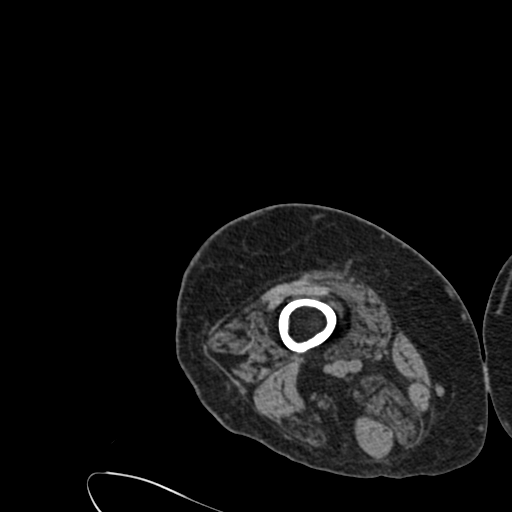
[im 60/168  soft-tissue]
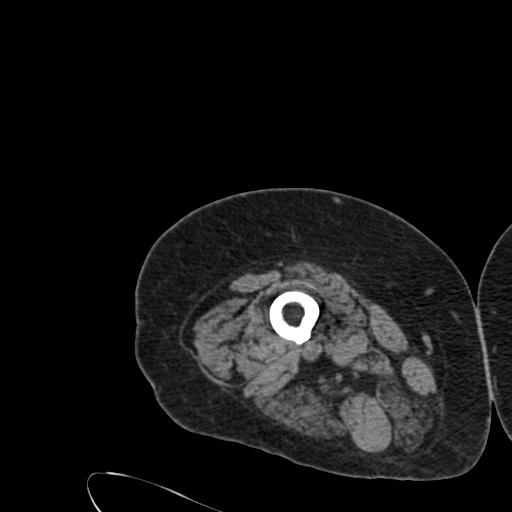
[im 76/168  soft-tissue]
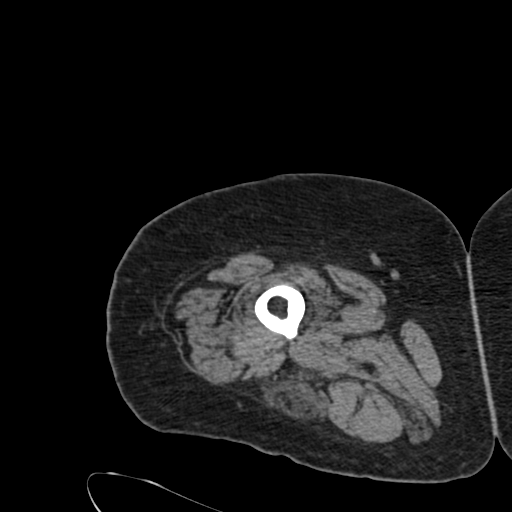
[im 92/168  soft-tissue]
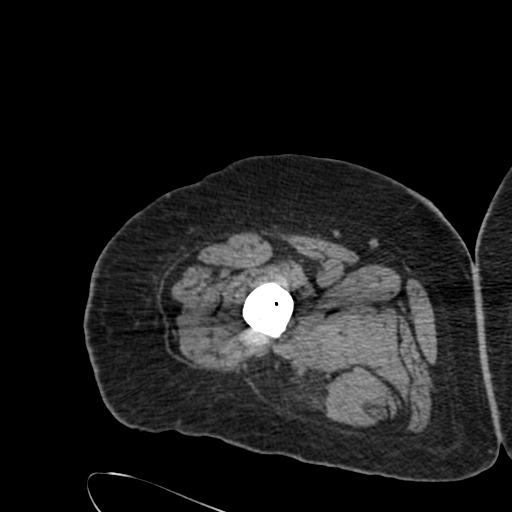
[im 108/168  soft-tissue]
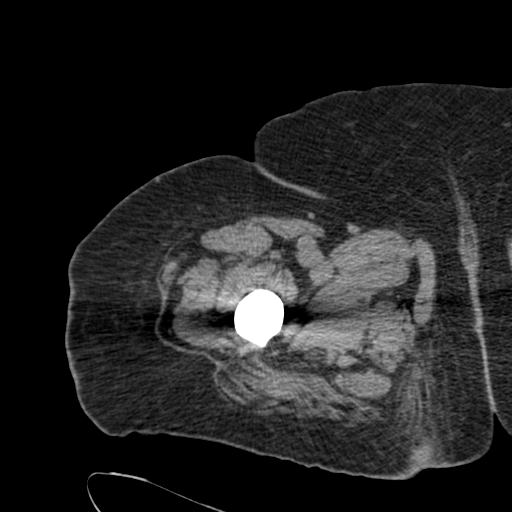
[im 124/168  soft-tissue]
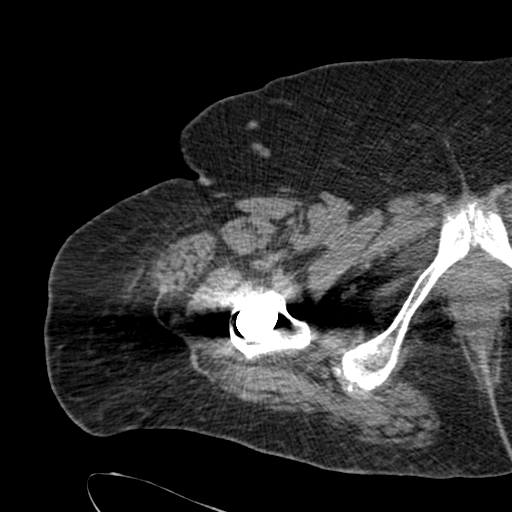
[im 141/168  soft-tissue]
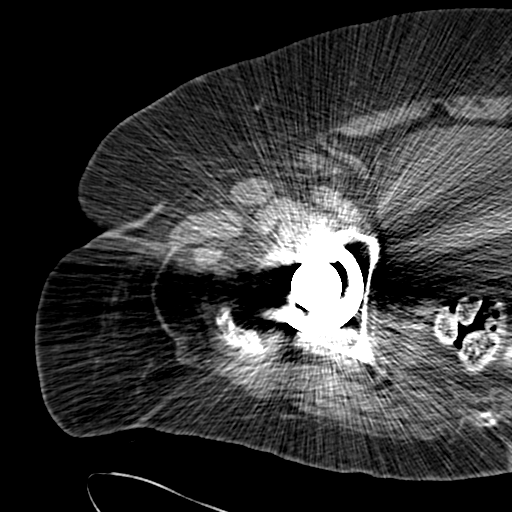
[im 141/168  bone]
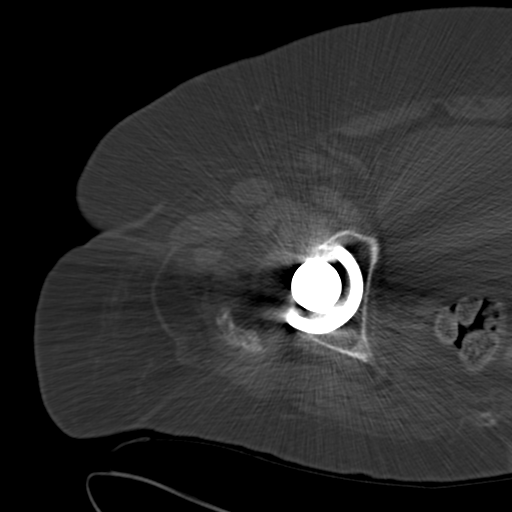
[im 146/168  lung]
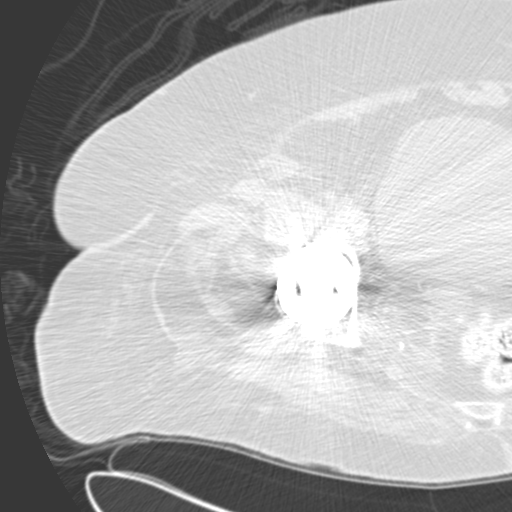
[im 151/168  lung]
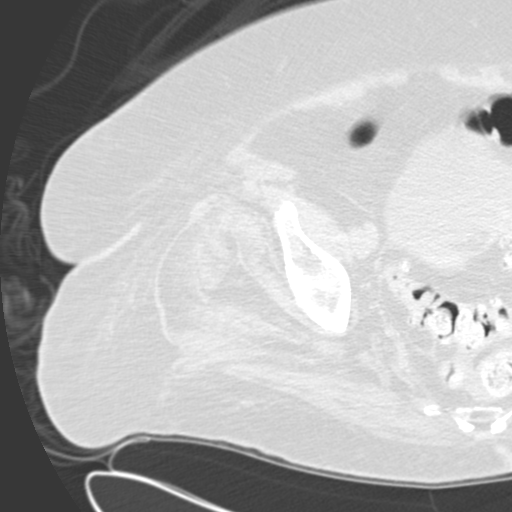
[im 157/168  soft-tissue]
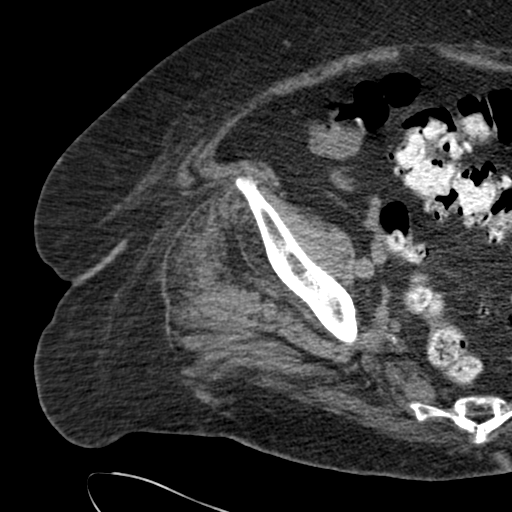
[im 157/168  lung]
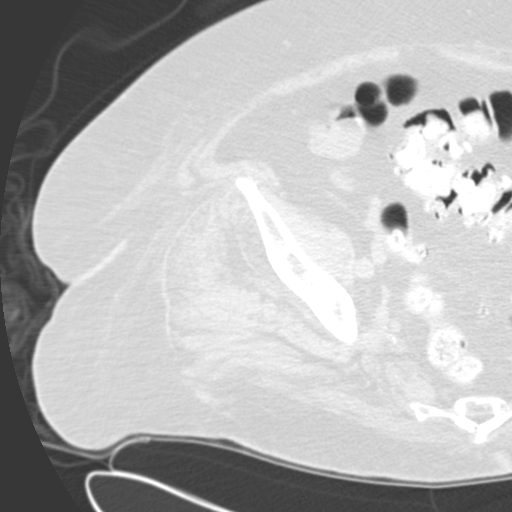
[im 162/168  lung]
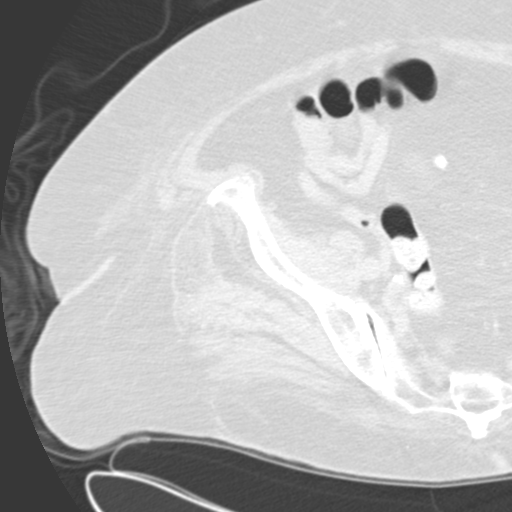

[Series 602: coronal st · coronal · 0.98mm/px · 3 of 66 slices shown]
[im 22/66  soft-tissue]
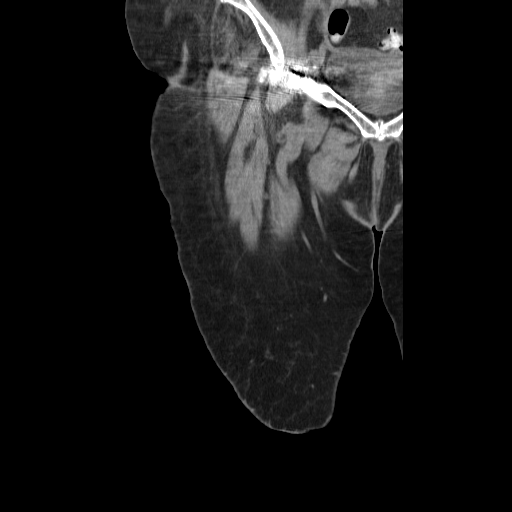
[im 29/66  soft-tissue]
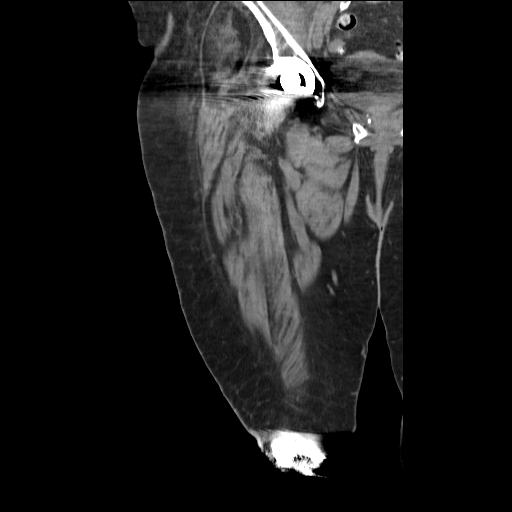
[im 37/66  soft-tissue]
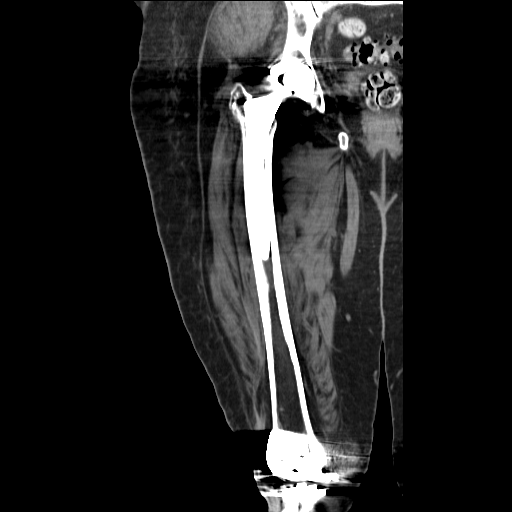

[16 of 46 positions shown; findings below may reference images not displayed]

PROCEDURE:     CT  - CT FEMUR /THIGH RIGHT WO  - May 18, 2011  [DATE]

RESULT:     CT of the right thigh is performed with a noncontrast technique.
Images are reconstructed at 3.0 mm slice thickness in the axial plane at
soft tissue and bone window settings and in the coronal plane at 3.0 mm
slice thickness. The study is significantly compromised by artifact from the
patient's right hip prosthesis and right knee prosthesis.

There is no evidence of abnormal abnormal soft tissue mass or calcification.
No abnormal fluid collection is evident.
IMPRESSION: 1. No focal abnormal fluid collection or solid mass.

[REDACTED]

## 2013-08-08 ENCOUNTER — Telehealth: Payer: Self-pay | Admitting: *Deleted

## 2013-08-08 NOTE — Telephone Encounter (Signed)
Fine to fax Rx.

## 2013-08-08 NOTE — Telephone Encounter (Signed)
Crystal from Hospice called requesting Morphine Sulfate 15mg  [facility]  [phone].  Please advise refill

## 2013-08-09 MED ORDER — MORPHINE SULFATE ER 15 MG PO TBCR: 15.0000 mg | EXTENDED_RELEASE_TABLET | Freq: Every day | ORAL | Status: DC

## 2013-08-09 NOTE — Telephone Encounter (Signed)
Rx printed, given to Dr. Gilford Rile for signature

## 2013-08-12 ENCOUNTER — Telehealth: Payer: Self-pay | Admitting: *Deleted

## 2013-08-12 MED ORDER — MORPHINE SULFATE (CONCENTRATE) 20 MG/ML PO SOLN: ORAL | Status: AC

## 2013-08-12 NOTE — Telephone Encounter (Signed)
Faxed to pharmacy

## 2013-08-12 NOTE — Telephone Encounter (Signed)
Vickie from Hospice called requesting refill for liquid morphine, last refill was 06/28/13.  Okay to refill?

## 2013-08-12 NOTE — Telephone Encounter (Signed)
Fine to fill. 

## 2013-08-26 ENCOUNTER — Telehealth: Payer: Self-pay | Admitting: *Deleted

## 2013-08-26 NOTE — Telephone Encounter (Signed)
Bianca Gomez called today states she called on Monday left message requesting Morphine Sulfate ER 15mg  once daily.  Please advise refill.

## 2013-08-26 NOTE — Telephone Encounter (Signed)
Fine to refill 

## 2013-08-29 MED ORDER — MORPHINE SULFATE ER 15 MG PO TBCR: 15.0000 mg | EXTENDED_RELEASE_TABLET | Freq: Every day | ORAL | Status: DC

## 2013-08-29 NOTE — Telephone Encounter (Signed)
Rx faxed to pharmacy, [place] notified

## 2013-09-09 ENCOUNTER — Telehealth: Payer: Self-pay | Admitting: *Deleted

## 2013-09-09 NOTE — Telephone Encounter (Signed)
Vickie, Nurse at hospice called requesting refill for morphine (MS CONTIN) 15 MG 12 hr tablet , Last refill on 09/01/13,  Okay to refill?

## 2013-09-09 NOTE — Telephone Encounter (Signed)
Fine to refill 

## 2013-09-13 MED ORDER — MORPHINE SULFATE ER 15 MG PO TBCR: 15.0000 mg | EXTENDED_RELEASE_TABLET | Freq: Every day | ORAL | Status: DC

## 2013-09-13 NOTE — Telephone Encounter (Signed)
Rx sent to pharmacy   

## 2013-09-26 ENCOUNTER — Telehealth: Payer: Self-pay | Admitting: *Deleted

## 2013-09-26 NOTE — Telephone Encounter (Signed)
Fine to refill 

## 2013-09-26 NOTE — Telephone Encounter (Signed)
Hospice nurse calling for refill on morphine (MS CONTIN) 15 MG 12 hr tablet, okay to refill?

## 2013-09-27 ENCOUNTER — Telehealth: Payer: Self-pay | Admitting: *Deleted

## 2013-09-27 MED ORDER — MORPHINE SULFATE ER 15 MG PO TBCR: 15.0000 mg | EXTENDED_RELEASE_TABLET | Freq: Every day | ORAL | Status: DC

## 2013-09-27 NOTE — Telephone Encounter (Signed)
She needs to be evaluated to see if these blisters are consistent with shingles. Is there a hospice physician who can evaluate her? I know that transport to the office would be difficult.

## 2013-09-27 NOTE — Telephone Encounter (Signed)
Bianca Gomez, hospice nurse, wanted you to be notified that Pt fell yesterday and hit her back, she has a large bruise on her back and a small one on elbow.  She also has red blisters, possibly shingles, they do not radiate anywhere, pt thinks they came from the fall, nurse feels like they are not from the fall.

## 2013-09-27 NOTE — Telephone Encounter (Signed)
Hospice physician, Dr Nyra Capes, prescribed medication for the shingles.

## 2013-10-07 ENCOUNTER — Telehealth: Payer: Self-pay | Admitting: *Deleted

## 2013-10-07 NOTE — Telephone Encounter (Signed)
Crystal, nurse from hospice, states that pt is dizzy when standing up, dysuria, wants a verbal order for urinalysis and culture

## 2013-10-07 NOTE — Telephone Encounter (Signed)
Crystal with hospice called, requesting verbal order for UA and culture, pt having dysuria and urinary frequency. Verbal order given to collect. FYI

## 2013-10-10 ENCOUNTER — Telehealth: Payer: Self-pay | Admitting: Internal Medicine

## 2013-10-10 NOTE — Telephone Encounter (Signed)
Urinalysis showed no signed of infection. Urine culture showed low levels of mixed bacteria. Her symptoms are likely not caused by urinary tract infection. If she continues to feel poorly, then will need to be seen.

## 2013-10-11 ENCOUNTER — Telehealth: Payer: Self-pay | Admitting: *Deleted

## 2013-10-11 MED ORDER — MORPHINE SULFATE ER 15 MG PO TBCR: 15.0000 mg | EXTENDED_RELEASE_TABLET | Freq: Every day | ORAL | Status: DC

## 2013-10-11 NOTE — Telephone Encounter (Signed)
Refill request for Morphine Sulfate ER 15mg . [facility] [address]  Please advise

## 2013-10-11 NOTE — Telephone Encounter (Signed)
Called and spoke with hospice nurse about pt's results and pt's complaints.  Nurse states that her shingles is on her private area and this is causing her discomfort.  Nurse asked about pt getting another round of valtrex, advised nurse to contact the hospice coordinator since she was the one to evaluate and prescribe it to her.

## 2013-10-11 NOTE — Telephone Encounter (Signed)
Fine to refill 

## 2013-10-26 ENCOUNTER — Telehealth: Payer: Self-pay | Admitting: Internal Medicine

## 2013-10-26 NOTE — Telephone Encounter (Signed)
Fine to refill 

## 2013-10-26 NOTE — Telephone Encounter (Signed)
Okay to refill? 

## 2013-10-26 NOTE — Telephone Encounter (Signed)
Bianca Gomez w/[facility] would like a prescription refill for Morphine Sulfate extended release tablets 14ml, one tablet daily with a 15 day supply, and write Hospice on the prescription.  She [facility] [address] [facility].

## 2013-10-27 MED ORDER — MORPHINE SULFATE ER 15 MG PO TBCR: 15.0000 mg | EXTENDED_RELEASE_TABLET | Freq: Every day | ORAL | Status: DC

## 2013-10-27 NOTE — Telephone Encounter (Signed)
Rx sent to pharmacy   

## 2013-11-01 ENCOUNTER — Encounter: Payer: Self-pay | Admitting: Internal Medicine

## 2013-11-03 ENCOUNTER — Telehealth: Payer: Self-pay

## 2013-11-03 NOTE — Telephone Encounter (Signed)
Called to schedule AWV with pt however there is no VM set up.

## 2013-11-04 ENCOUNTER — Telehealth: Payer: Self-pay | Admitting: *Deleted

## 2013-11-04 NOTE — Telephone Encounter (Signed)
Fine to give flu shot. Please confirm with pt no allergy to eggs or the vaccine.

## 2013-11-04 NOTE — Telephone Encounter (Signed)
Vickie from Hospice called to get verbal authorization to give pt flu shot

## 2013-11-07 ENCOUNTER — Telehealth: Payer: Self-pay | Admitting: *Deleted

## 2013-11-07 ENCOUNTER — Encounter: Payer: Self-pay | Admitting: General Surgery

## 2013-11-07 MED ORDER — AMOXICILLIN-POT CLAVULANATE 500-125 MG PO TABS: 500.0000 mg | ORAL_TABLET | Freq: Two times a day (BID) | ORAL | Status: AC

## 2013-11-07 MED ORDER — MORPHINE SULFATE ER 15 MG PO TBCR: 15.0000 mg | EXTENDED_RELEASE_TABLET | Freq: Every day | ORAL | Status: DC

## 2013-11-07 MED ORDER — PREDNISONE (PAK) 10 MG PO TABS: ORAL_TABLET | Freq: Every day | ORAL | Status: AC

## 2013-11-07 NOTE — Telephone Encounter (Signed)
Spoke with Vickie at [facility], Rx faxed to pharmacy

## 2013-11-07 NOTE — Telephone Encounter (Signed)
Notified hospice nurse, Vickie.  Vickie states that she feels that pt has an upper respiratory infection and requesting for an antibiotic called in. Vickie states they have had to increase pt oxygen to 4 liters and that pt's health is really declining.

## 2013-11-07 NOTE — Telephone Encounter (Signed)
We can call in Augmentin 500mg  po bid x 7 days. Please confirm no allergy to penicillin

## 2013-11-07 NOTE — Telephone Encounter (Signed)
Bianca Gomez called sates pt is experiencing increased wheezing and crackles with green sputum production. Further states pt is symptomatic and is currently on 4 liters of O2.  Requesting Prednisone Taper and Antibiotic.  Also requesting refill on pts Morphine Sulfate Rx and Flu shot order once pt is symptom free.  Please advise

## 2013-11-07 NOTE — Telephone Encounter (Signed)
I previously sent a message stating we could call in Augmentin 500mg  po bid x 7 days. We can also send a Prednisone taper 10mg  tablets, take 60mg  day 1, taper by 10mg  daily #21 tablets. Fine to refill Morphine Sulfate

## 2013-11-08 NOTE — Telephone Encounter (Signed)
Confirmed no allergy to penicillin, Rx sent to pharmacy

## 2013-11-18 ENCOUNTER — Other Ambulatory Visit: Payer: Self-pay | Admitting: Internal Medicine

## 2013-11-25 ENCOUNTER — Encounter: Payer: Self-pay | Admitting: Internal Medicine

## 2013-11-30 ENCOUNTER — Telehealth: Payer: Self-pay | Admitting: *Deleted

## 2013-11-30 MED ORDER — MORPHINE SULFATE ER 15 MG PO TBCR: 15.0000 mg | EXTENDED_RELEASE_TABLET | Freq: Every day | ORAL | Status: DC

## 2013-11-30 NOTE — Telephone Encounter (Signed)
rx faxed, Crystal aware

## 2013-11-30 NOTE — Telephone Encounter (Signed)
I talked with the patient and she would be glad for you to come by and visit. She is on home oxygen and has good days and bad days. She states the surgery site is doing well as far as she could tell. I told her I wasn't sure when the visit would be she just ask that you call before you come. She really appreciates our phone call.

## 2013-11-30 NOTE — Telephone Encounter (Signed)
-----   Message from Robert Bellow, MD sent at 11/30/2013  7:53 AM EST ----- See note at your desk.. See if patient up for a home visit for post mastectomy check.

## 2013-11-30 NOTE — Telephone Encounter (Signed)
Fine to refill 

## 2013-11-30 NOTE — Telephone Encounter (Signed)
Crystal from Hospice called requesting Morphine 15mg  ER be faxed to [facility].  Hospice Pt.  Please advise

## 2013-12-12 ENCOUNTER — Telehealth: Payer: Self-pay | Admitting: *Deleted

## 2013-12-12 MED ORDER — MORPHINE SULFATE ER 15 MG PO TBCR: 15.0000 mg | EXTENDED_RELEASE_TABLET | Freq: Every day | ORAL | Status: DC

## 2013-12-12 NOTE — Telephone Encounter (Signed)
Fine to send

## 2013-12-12 NOTE — Telephone Encounter (Signed)
Crystal called requesting Morphine Sulfate refill faxed to [place] [phone].  Please advise

## 2013-12-22 ENCOUNTER — Ambulatory Visit: Payer: Self-pay | Admitting: General Surgery

## 2013-12-22 ENCOUNTER — Other Ambulatory Visit: Payer: Self-pay | Admitting: Internal Medicine

## 2013-12-23 NOTE — Telephone Encounter (Signed)
Ok refill? Hospice patient

## 2013-12-26 ENCOUNTER — Telehealth: Payer: Self-pay | Admitting: Internal Medicine

## 2013-12-26 ENCOUNTER — Other Ambulatory Visit: Payer: Self-pay | Admitting: *Deleted

## 2013-12-26 MED ORDER — MORPHINE SULFATE ER 15 MG PO TBCR: 15.0000 mg | EXTENDED_RELEASE_TABLET | Freq: Every day | ORAL | Status: DC

## 2013-12-26 MED ORDER — MORPHINE SULFATE ER 15 MG PO TBCR: 15.0000 mg | EXTENDED_RELEASE_TABLET | Freq: Two times a day (BID) | ORAL | Status: DC

## 2013-12-26 NOTE — Telephone Encounter (Signed)
That is fine 

## 2013-12-26 NOTE — Telephone Encounter (Signed)
[name] from Hospice called asking for a refill of Morphine Sulphate Extended Relief 15MG  Tablet. [name] said Bianca Gomez is using more liquid morphine day and night so she's wondering if Dr. Gilford Rile will approve of Ms. Mogle taking 1 tablet during the day and 1 at night (12hrs apart). Please advise and please call Casilda Carls if you have any questions or concerns. Casilda Carls ph# [phone] Thank you.

## 2013-12-26 NOTE — Telephone Encounter (Signed)
Rx printed and put in folder for signature

## 2013-12-28 NOTE — Telephone Encounter (Signed)
Rx faxed to pharmacy  

## 2014-01-02 ENCOUNTER — Telehealth: Payer: Self-pay

## 2014-01-02 NOTE — Telephone Encounter (Signed)
Spoke with Vickie at hospice.  Pt is now using [facility] since they will deliver her medications to her home.  [facility], but will need Rx's sent to [facility] from now on.

## 2014-01-02 NOTE — Telephone Encounter (Signed)
Fine to refill 

## 2014-01-02 NOTE — Telephone Encounter (Signed)
The hospice nurse called and is hoping to get the patient's morphine tablets called [facility]

## 2014-01-09 ENCOUNTER — Other Ambulatory Visit: Payer: Self-pay | Admitting: *Deleted

## 2014-01-09 ENCOUNTER — Telehealth: Payer: Self-pay | Admitting: *Deleted

## 2014-01-09 MED ORDER — MORPHINE SULFATE ER 15 MG PO TBCR: 15.0000 mg | EXTENDED_RELEASE_TABLET | Freq: Two times a day (BID) | ORAL | Status: DC

## 2014-01-09 NOTE — Telephone Encounter (Signed)
Fine to refill 

## 2014-01-09 NOTE — Telephone Encounter (Signed)
Bianca Gomez called requesting Morphine Sulfate refill.  Bianca Gomez states the Rx has really helped.  The pharmacy to be faxed is [facility] [phone].  Please advise

## 2014-01-10 NOTE — Telephone Encounter (Signed)
Rx faxed

## 2014-01-16 ENCOUNTER — Other Ambulatory Visit: Payer: Self-pay | Admitting: Internal Medicine

## 2014-01-16 NOTE — Telephone Encounter (Signed)
Electronic Rx request for Mucinex received. Last office visit 11/10/12. No record of this medication being on patient's medication list. Please advise.

## 2014-01-16 NOTE — Telephone Encounter (Signed)
Spoke with Vickie, advised of BMP order.  Rx sent

## 2014-01-16 NOTE — Telephone Encounter (Signed)
Can you confirm with Hospice nurse that she has been taking this? We do not have any recent labs on her.

## 2014-01-16 NOTE — Telephone Encounter (Signed)
Last OV 11.5.14.  Medication is a historical med.  Please advise refill

## 2014-01-16 NOTE — Telephone Encounter (Signed)
OK. We can refill and we should have her check a BMP

## 2014-01-16 NOTE — Telephone Encounter (Signed)
Spoke with [place], [phone], pt is home bound and no labs have been ordered.  States pt is still taking Potassium.  Further states if pt needs labs, place order and she will get them done.

## 2014-01-17 ENCOUNTER — Other Ambulatory Visit: Payer: Self-pay | Admitting: Internal Medicine

## 2014-01-17 ENCOUNTER — Telehealth: Payer: Self-pay | Admitting: *Deleted

## 2014-01-17 NOTE — Telephone Encounter (Signed)
Pt had called back in November/December 2015 and had talked with [name] about you coming to visit her in her home sometime and she hasn't heard anything else about this. She wanted you to call her so she can talk to you.

## 2014-01-18 LAB — BASIC METABOLIC PANEL
BUN: 12 mg/dL (ref 4–21)
Creatinine: 0.7 mg/dL (ref 0.5–1.1)
Glucose: 90 mg/dL
Potassium: 3.4 mmol/L (ref 3.4–5.3)
SODIUM: 143 mmol/L (ref 137–147)

## 2014-01-18 NOTE — Telephone Encounter (Signed)
Contact patient and see if we can do this at noontime on January 14th.

## 2014-01-18 NOTE — Telephone Encounter (Signed)
Last OV 11.5.14.  Unable to contact pt by phone for appoint.  Please advise refill

## 2014-01-18 NOTE — Telephone Encounter (Signed)
I talked with the patient and she said "that would be wonderful". Thanks again.

## 2014-01-19 ENCOUNTER — Telehealth: Payer: Self-pay | Admitting: *Deleted

## 2014-01-19 ENCOUNTER — Ambulatory Visit: Payer: BC Managed Care – PPO | Admitting: General Surgery

## 2014-01-19 DIAGNOSIS — Z853 Personal history of malignant neoplasm of breast: Secondary | ICD-10-CM

## 2014-01-19 NOTE — Telephone Encounter (Signed)
Tried to call patient today to verify insurance. We have [employer] on file and need to see if patient is covered under Medicare.  No answer and not able to leave a message.

## 2014-01-19 NOTE — Telephone Encounter (Signed)
Notified patient as instructed, patient pleased. Discussed follow-up appointments, patient agrees  

## 2014-01-19 NOTE — Telephone Encounter (Signed)
-----   Message from Robert Bellow, MD sent at 01/19/2014  3:24 PM EST ----- Please notify the patient to complete her present supply of medication. She does not need to continue after that supply is exhausted . Thanks.

## 2014-01-19 NOTE — Telephone Encounter (Signed)
Spoke with patient today and insurance information has been updated.  [place] notified.

## 2014-01-20 NOTE — Progress Notes (Signed)
Patient ID: Bianca Gomez, female   DOB: Dec 05, 1927, 81 y.o.   MRN: [id number]  No chief complaint on file.   HPI Bianca Gomez is a 81 y.o. female.  The patient was seen at her home accompanied by Colman Cater, RN. The patient was unable to travel to the office due to her progressive pulmonary dysfunction. The patient reports profound shortness of breath with short ambulation, relatively asymptomatic at rest. This is her 5 year visit post mastectomy for breast cancer.  Results for orders placed or performed in visit on 90/24/09  Basic metabolic panel  Result Value Ref Range   Glucose 90 mg/dL   BUN 12 4 - 21 mg/dL   Creatinine 0.7 0.5 - 1.1 mg/dL   Potassium 3.4 3.4 - 5.3 mmol/L   Sodium 143 137 - 147 mmol/L     HPI  Past Medical History  Diagnosis Date  . Depression   . Hypertension   . Hyperlipidemia   . Thyroid disease   . Cancer 2010    bilateral  . Arthritis   . Obesity   . Pulmonary hypertension 2013  . History of MRSA infection   . Stroke 2014    Past Surgical History  Procedure Laterality Date  . Breast surgery  2010    s/p bilateral mastectomy  . Foot surgery  2006  . Cholecystectomy  1964  . Knee surgery  2003  . Joint replacement Left 2005    hip   . Shoulder surgery  2007  . Phrenic nerve pacemaker implantation  2013  . Skin cancer excision  2012    neck  . Colonoscopy  2003    In [place]  . Pacemaker insertion      Family History  Problem Relation Age of Onset  . Cancer Other     unknown family member with breast cancer    Social History History  Substance Use Topics  . Smoking status: Never Smoker   . Smokeless tobacco: Never Used  . Alcohol Use: No    Allergies  Allergen Reactions  . Lithium     Tremors  . Norvasc [Amlodipine Besylate]     Current Outpatient Prescriptions  Medication Sig Dispense Refill  . ALPRAZolam (XANAX) 0.25 MG tablet Take 0.25 mg by mouth 3 (three) times daily as needed for anxiety (Every 6 hours ans  needed for anxiety or shortness of breath).    Marland Kitchen amoxicillin-clavulanate (AUGMENTIN) 500-125 MG per tablet Take 1 tablet (500 mg total) by mouth 2 (two) times daily. 14 tablet 0  . aspirin 81 MG tablet Take 81 mg by mouth daily.      . fluticasone (FLONASE) 50 MCG/ACT nasal spray Place 2 sprays into the nose daily.      . furosemide (LASIX) 20 MG tablet TAKE 3 TABLETS ORALLY TWICE A DAY AT 9AM AND 5PM 90 tablet 3  . guaiFENesin (MUCINEX) 600 MG 12 hr tablet TAKE TWO TABLETS TWICE DAILY 60 tablet 1  . HYDROcodone-acetaminophen (NORCO/VICODIN) 5-325 MG per tablet Take 1 tablet by mouth every 6 (six) hours as needed for pain. 90 tablet 5  . letrozole (FEMARA) 2.5 MG tablet Take 1 tablet (2.5 mg total) by mouth daily. 30 tablet 6  . levothyroxine (SYNTHROID, LEVOTHROID) 125 MCG tablet TAKE 1 TABLET BY MOUTH EVERY DAY 30 tablet 8  . levothyroxine (SYNTHROID, LEVOTHROID) 125 MCG tablet TAKE 1 TABLET BY MOUTH EVERY DAY 30 tablet 0  . levothyroxine (SYNTHROID, LEVOTHROID) 125 MCG tablet TAKE 1 TABLET  BY MOUTH EVERY DAY 30 tablet 0  . morphine (MS CONTIN) 15 MG 12 hr tablet Take 1 tablet (15 mg total) by mouth 2 (two) times daily. 60 tablet 0  . morphine (ROXANOL) 20 MG/ML concentrated solution Take by mouth every 4 (four) hours as needed for severe pain (Take 0.25 mL every 4 hours as needed for pain and or shortness of breath). 120 mL 0  . Oxcarbazepine (TRILEPTAL) 300 MG tablet Take 1 tablet (300 mg total) by mouth 2 (two) times daily. Take 300 mg in the AM and 600 mg in the PM 90 tablet 6  . polyethylene glycol (MIRALAX / GLYCOLAX) packet Take 17 g by mouth daily as needed.    . potassium chloride (KLOR-CON) 20 MEQ packet Take by mouth 2 (two) times daily.    . potassium chloride SA (K-DUR,KLOR-CON) 20 MEQ tablet TAKE ONE TABLET BY MOUTH TWICE DAILY 30 tablet 3  . potassium gluconate 595 MG TABS Take 595 mg by mouth daily.    . predniSONE (DELTASONE) 10 MG tablet Take 1 tablet (10 mg total) by mouth  daily with breakfast. 30 tablet 0  . predniSONE (STERAPRED UNI-PAK) 10 MG tablet Take by mouth daily. Began at 60 mg tapering by 10 mg a day until to zero 21 tablet 0  . senna (SENOKOT) 8.6 MG TABS tablet Take 1 tablet by mouth daily as needed for mild constipation (Take 1-2 tablets twice a day).    . sertraline (ZOLOFT) 100 MG tablet Take 2 tablets (200 mg total) by mouth daily. 60 tablet 3  . simvastatin (ZOCOR) 40 MG tablet Take 1 tablet (40 mg total) by mouth every evening. 30 tablet 6  . zolpidem (AMBIEN) 5 MG tablet Take 1 tablet (5 mg total) by mouth at bedtime as needed for sleep. 30 tablet 5   No current facility-administered medications for this visit.    Review of Systems Review of Systems  There were no vitals taken for this visit.  Physical Exam Physical Exam Examination of the chest wall shows no evidence of recurrent cancer.   Assessment    Doing well 5 years status post Bilateral mastectomy followed by Femara therapy.     Plan    Review of the notes from Delight Hoh, M.D. From her initial evaluation suggested that 5 years of Femara therapy was appropriate. The patient has been instructed to complete her present supply and then discontinue this medication. Follow up from here will be on an as-needed basis.        Bianca Gomez 01/20/2014, 4:49 PM

## 2014-01-23 ENCOUNTER — Telehealth: Payer: Self-pay | Admitting: *Deleted

## 2014-01-23 ENCOUNTER — Other Ambulatory Visit: Payer: Self-pay | Admitting: Internal Medicine

## 2014-01-23 ENCOUNTER — Other Ambulatory Visit: Payer: Self-pay | Admitting: *Deleted

## 2014-01-23 MED ORDER — MORPHINE SULFATE ER 15 MG PO TBCR: 15.0000 mg | EXTENDED_RELEASE_TABLET | Freq: Two times a day (BID) | ORAL | Status: DC

## 2014-01-23 NOTE — Telephone Encounter (Signed)
Fine to refill 

## 2014-01-23 NOTE — Telephone Encounter (Signed)
Vicky from Hospice is requesting a refill for Morphine Sulfate.  Pt now receives prescriptions from [facility].  Please advise

## 2014-01-23 NOTE — Telephone Encounter (Signed)
rx filled

## 2014-01-24 NOTE — Telephone Encounter (Signed)
Med list is 5 mg, request is for 10 mg.

## 2014-02-06 ENCOUNTER — Telehealth: Payer: Self-pay

## 2014-02-06 ENCOUNTER — Other Ambulatory Visit: Payer: Self-pay | Admitting: Internal Medicine

## 2014-02-06 NOTE — Telephone Encounter (Signed)
[facility] called and is requesting a refill on the patient's Morphine Sulfate extended release 15mg   (the pt is taking it twice per day).  The RN is hoping to see if the patient can take the medication every 8 hours (3 times per day)  Hospice callback - [phone] [facility])

## 2014-02-06 NOTE — Telephone Encounter (Signed)
Fine to refill 

## 2014-02-07 ENCOUNTER — Other Ambulatory Visit: Payer: Self-pay | Admitting: *Deleted

## 2014-02-07 MED ORDER — MORPHINE SULFATE ER 15 MG PO TBCR: EXTENDED_RELEASE_TABLET | ORAL | Status: DC

## 2014-02-07 NOTE — Telephone Encounter (Signed)
rx faxed

## 2014-02-21 ENCOUNTER — Telehealth: Payer: Self-pay | Admitting: *Deleted

## 2014-02-21 MED ORDER — MORPHINE SULFATE ER 15 MG PO TBCR: EXTENDED_RELEASE_TABLET | ORAL | Status: DC

## 2014-02-21 NOTE — Telephone Encounter (Signed)
Crystal from Hospice called requesting MS Contin refill.  Requests Rx sent to [facility] pharmacy at [phone].  Please advise refill

## 2014-02-21 NOTE — Telephone Encounter (Signed)
Fine to refill 

## 2014-02-21 NOTE — Telephone Encounter (Signed)
Bianca Gomez is aware Rx has been faxed

## 2014-02-22 ENCOUNTER — Other Ambulatory Visit: Payer: Self-pay | Admitting: Internal Medicine

## 2014-02-22 NOTE — Telephone Encounter (Signed)
Last Ov 11.5.14, pt is a hospice pt.  Please advise refill

## 2014-02-27 ENCOUNTER — Telehealth: Payer: Self-pay | Admitting: *Deleted

## 2014-02-27 MED ORDER — AMOXICILLIN-POT CLAVULANATE 500-125 MG PO TABS: 1.0000 | ORAL_TABLET | Freq: Two times a day (BID) | ORAL | Status: AC

## 2014-02-27 NOTE — Telephone Encounter (Signed)
rx called in to pharmacy, [facility] notified

## 2014-02-27 NOTE — Telephone Encounter (Signed)
We can call in Augmentin 500mg  bid #20. Please double check no allergy to this.

## 2014-02-27 NOTE — Telephone Encounter (Signed)
Crystal from Hosp[facility] called states today pt had crackles and wheezing through out lung fields, reports pt is also coughing up thick green sputum.  Further reports pt is using her nebulizer.  Requesting an antibiotic to be sent to [facility].  Please advise

## 2014-03-20 ENCOUNTER — Other Ambulatory Visit: Payer: Self-pay | Admitting: Internal Medicine

## 2014-03-20 NOTE — Telephone Encounter (Signed)
Refill

## 2014-03-27 ENCOUNTER — Telehealth: Payer: Self-pay | Admitting: *Deleted

## 2014-03-27 NOTE — Telephone Encounter (Signed)
Hospice Rn called requesting Morphine Sulfate Rx be faxed into [facility] pharmacy.  Please advise

## 2014-03-27 NOTE — Telephone Encounter (Signed)
Fine to send in Rx.

## 2014-03-28 ENCOUNTER — Other Ambulatory Visit: Payer: Self-pay | Admitting: *Deleted

## 2014-03-28 MED ORDER — MORPHINE SULFATE ER 15 MG PO TBCR: EXTENDED_RELEASE_TABLET | ORAL | Status: DC

## 2014-03-28 NOTE — Telephone Encounter (Signed)
rx faxed

## 2014-03-30 ENCOUNTER — Other Ambulatory Visit: Payer: Self-pay | Admitting: Internal Medicine

## 2014-03-30 NOTE — Telephone Encounter (Signed)
Ok refill? 

## 2014-04-13 ENCOUNTER — Other Ambulatory Visit: Payer: Self-pay | Admitting: Internal Medicine

## 2014-04-13 NOTE — Telephone Encounter (Signed)
She is under hospice care. Please confirm with hospice nurse that she is taking this medication.

## 2014-04-13 NOTE — Telephone Encounter (Signed)
Ok refill? Not on current med list. Last visit 11/14

## 2014-04-17 ENCOUNTER — Telehealth: Payer: Self-pay | Admitting: *Deleted

## 2014-04-17 ENCOUNTER — Other Ambulatory Visit: Payer: Self-pay | Admitting: *Deleted

## 2014-04-17 ENCOUNTER — Other Ambulatory Visit: Payer: Self-pay | Admitting: Internal Medicine

## 2014-04-17 MED ORDER — MORPHINE SULFATE ER 15 MG PO TBCR: EXTENDED_RELEASE_TABLET | ORAL | Status: DC

## 2014-04-17 NOTE — Telephone Encounter (Signed)
Please advise refill? 

## 2014-04-17 NOTE — Telephone Encounter (Signed)
Crystal from [facility]osp[facility] called requesting Morphine Sulfate refill.  Last refill 3.22.16 #90.  Please advise refill

## 2014-04-17 NOTE — Telephone Encounter (Signed)
Fine to refill 

## 2014-04-18 ENCOUNTER — Telehealth: Payer: Self-pay | Admitting: *Deleted

## 2014-04-18 ENCOUNTER — Other Ambulatory Visit: Payer: Self-pay | Admitting: *Deleted

## 2014-04-18 MED ORDER — AMOXICILLIN 500 MG PO TABS: 500.0000 mg | ORAL_TABLET | Freq: Three times a day (TID) | ORAL | Status: AC

## 2014-04-18 NOTE — Telephone Encounter (Signed)
Prescription sent to pharmacy, notified home health nurse

## 2014-04-18 NOTE — Telephone Encounter (Signed)
Faxed to pharmacy

## 2014-04-18 NOTE — Telephone Encounter (Signed)
We can use Amoxicillin 500mg  po tid x 7 days.

## 2014-04-18 NOTE — Telephone Encounter (Signed)
Hospice nurse states that pt has increased shortness of breath, crackles, wheezing and yellow sputum.  Requesting antibiotic

## 2014-04-21 ENCOUNTER — Other Ambulatory Visit: Payer: Self-pay | Admitting: Internal Medicine

## 2014-04-21 NOTE — Telephone Encounter (Signed)
Last thyroid lab in 2014, Okay to refill?

## 2014-04-24 ENCOUNTER — Other Ambulatory Visit: Payer: Self-pay | Admitting: Internal Medicine

## 2014-04-24 NOTE — Telephone Encounter (Signed)
Ok refill, not on current med list?

## 2014-04-27 ENCOUNTER — Other Ambulatory Visit: Payer: Self-pay | Admitting: Internal Medicine

## 2014-04-27 NOTE — Telephone Encounter (Signed)
Patient's Pharmacy requesting a refill on Ranitidine (Zantac) 150mg  Daily.  Patient's last OV was 11/20/12.  She is a Hospice Patient.  Per the Rx this prescription was filled 3 weeks ago for only 15 days.  Please advise?

## 2014-04-28 ENCOUNTER — Other Ambulatory Visit: Payer: Self-pay | Admitting: Internal Medicine

## 2014-04-28 MED ORDER — RANITIDINE HCL 150 MG PO TABS: 150.0000 mg | ORAL_TABLET | Freq: Every day | ORAL | Status: AC

## 2014-04-28 NOTE — H&P (Signed)
PATIENT NAME:  Bianca Gomez, Bianca Gomez MR#:  [id number] DATE OF BIRTH:  Mar 25, 1927  DATE OF ADMISSION:  08/10/2012  PRIMARY CARE PHYSICIAN: Ronette Deter.   REFERRING PHYSICIAN: Harvest Dark.   CHIEF COMPLAINT: Loss of consciousness.   HISTORY OF PRESENT ILLNESS: Bianca Gomez is an 81 year old Caucasian female who was in her usual state of health until last evening while she was sitting with family members. They noticed that she was a little drowsy and not herself. They noticed her to have some right arm jerking, some slurred speech and then she lost consciousness. The episode of loss of consciousness lasted about 10 minutes, after which she woke up but the family noticed that she was not herself, a little bit confused, not keeping eye contact. They called the EMS, and the patient was transported to the Emergency Room, during which she was noticed also to have some agitation. Thereafter, the patient improved, and she was back to her normal state. The patient, herself, does not remember the event or anything that was mentioned. Denies any headache. No tongue biting. No urinary or stool incontinence. She had CAT scan of the head which was negative; however, her EKG showed new ST-T wave abnormalities in the inferolateral leads. These are new compared to her EKG last year in May 2013; however, the patient denies having any chest pain. The hospitalist was consulted to admit the patient for further observation and workup.   REVIEW OF SYSTEMS:   CONSTITUTIONAL: Denies having any fever. No chills. No fatigue.  EYES: No . No double vision, but she says that her vision is blurry. This is for a while but nothing acute today.  ENT: No hearing impairment. No sore throat. No dysphagia.  CARDIOVASCULAR: No chest pain. No shortness of breath. No edema, but she had an episode of syncope as described above.  RESPIRATORY: No chest pain. No shortness of breath. No cough or hemoptysis.  GASTROINTESTINAL: No abdominal pain.  No vomiting. No diarrhea.  GENITOURINARY: No dysuria. No frequency of urination.  MUSCULOSKELETAL: No joint pain or swelling. No muscular pain or swelling.  INTEGUMENTARY: No skin rash. No ulcers.  NEUROLOGY: No focal weakness. She had an episode of syncope, and I am not sure if it was seizure activity. Denies headache.  PSYCHIATRY: She has bipolar, manic depressive illness. Denies anxiety.  ENDOCRINE: No polyuria or polydipsia. No heat or cold intolerance.   PAST MEDICAL HISTORY: Last admission here was in May 2013. At that time, she had syncope. Thought that it could be related to orthostatic hypotension. She also had an episode of atrial flutter with slow ventricular response. She ended by having pacemaker insertion. Her past history also includes idiopathic peripheral neuropathy, history of breast cancer status post bilateral mastectomy, hypothyroidism, osteoporosis, bipolar, manic depressive illness, systemic hypertension. She was also labeled by the old records that she has history of congestive heart failure; however, her last echocardiogram in May 2013 showed ejection fraction of 63%, left ventricular concentric hypertrophy and the systolic function was normal, with normal wall motion. Mild aortic stenosis.   PAST SURGICAL HISTORY: Bilateral mastectomy for breast cancer, knee joint replacement, hip replacement, shoulder replacement, appendectomy and cholecystectomy.   SOCIAL HABITS: Nonsmoker. No history of alcohol or drug abuse.   SOCIAL HISTORY: She is widowed. Lives at home alone.   FAMILY HISTORY: She has no information about her parent's other than that they died when she was at the age of 69. After that, she was adopted.   ADMISSION MEDICATIONS: Torsemide  10 mg a day, sertraline or Zoloft 100 mg taking 2 tablets (that is 200 mg) a day, potassium gluconate 595 mg tablet once a day, oyster shell calcium twice a day, oxcarbazepine 300 mg in the morning and 600 mg in the evening,  levothyroxine 125 mcg once a day, letrozole 2.5 mg a day, gabapentin 100 mg at bedtime, Fosamax 70 mg once a week, folic acid 782 mcg once a day, fluticasone 2 sprays nasal spray a day, aspirin 81 mg a day, Ambien 5 mg a day, multivitamin once a day, vitamin C 500 mg once a day.   ALLERGIES: REPORTED LITHIUM AND SOME KIND OF ADVERSE EFFECT FROM NORVASC.   PHYSICAL EXAMINATION:  VITAL SIGNS: Blood pressure 166/77, respiratory rate 18, pulse 62, oxygen saturation 99%.  GENERAL APPEARANCE: Elderly female lying in bed in no acute distress.  HEAD AND NECK: No pallor. No icterus. No cyanosis. Ear examination revealed normal hearing, no discharge, no lesions. Examination of the nose showed no bleeding, no discharge, no ulcers. Oropharyngeal examination revealed normal lips, dry mucous membranes, dry tongue. No oral thrush. Eye examination revealed normal eyelids and conjunctivae. Pupils about 4 mm, round, equal and sluggishly reactive to light. Neck is supple. Trachea at midline. No thyromegaly. No cervical lymphadenopathy. No masses.  HEART: Normal S1, S2. No S3, S4. No murmur. No gallop. No carotid bruits.  RESPIRATORY: Normal breathing pattern without use of accessory muscles. No rales. No wheezing.  ABDOMEN: Soft without tenderness. No hepatosplenomegaly. No masses.  SKIN: No ulcers. No subcutaneous nodules.  MUSCULOSKELETAL: No joint swelling. No clubbing.  NEUROLOGIC: Cranial nerves II through XII were intact. No focal motor deficit.  PSYCHIATRIC: The patient is alert, oriented x3. She recognized the people inside the Emergency Department. She knows the name of the hospital and the city. She knows who is the Software engineer of the [place]. She is oriented to the date. However, most of the time, she does not keep eye contact.   LABORATORY FINDINGS: Her chest x-ray showed pulmonary edema findings. Pacemaker device was noted. CAT scan of the head showed no acute intracranial abnormalities. Her EKG showed  normal sinus rhythm at rate of 67 per minute. ST-T wave abnormalities in the inferolateral leads These are new changes compared to her EKG done in May 2013. Serum glucose 130, BUN 22, creatinine 0.8, sodium 136, potassium 3.6. Liver function tests and liver transaminases were normal. Troponin less than 0.02. CBC showed white count of 6000, hemoglobin 14, hematocrit 41, platelet count 169. Prothrombin time 15. INR 1.2.   ASSESSMENT:  1. Syncope. It is also unclear if she had a seizure episode and then postictal phase. Or cardiac arrhythmia leading to syncope. 2. Acute pulmonary edema.  3. New EKG abnormalities compared to her EKG done in May 2013,  4. Systemic hypertension.  5. Bipolar, manic depressive illness.  6. Pacemaker insertion done in May 2013.  7. History of orthostatic hypotension.  8. Idiopathic peripheral neuropathy.  9. Hypothyroidism.  10. Osteoporosis.  11. The patient has breast cancer, status post bilateral mastectomy.   PLAN: Will admit the patient to the telemetry unit. IV Lasix was ordered. I will hold the oral torsemide. Oxygen supplementation. Of note, the patient states now that after oxygen she feels much better, although she did not complain of shortness of breath prior to that. Follow up on cardiac enzymes. I ordered an echocardiogram for re-evaluation of her left ventricular function and valvular status. Check B-type natriuretic peptide. Cardiology consultation. Regarding her syncope,  will do neurologic checkups and followup, although her syncope could be from cardiac arrhythmia leading to hypotension and decreased cerebral perfusion. The patient indicated that she has a Living Will, and her code status is DO NOT RESUSCITATE.   Time spent in evaluating this patient took more than 1 hour and 15 minutes, including reviewing her medical records.   ____________________________ Clovis Pu. Lenore Manner, MD amd:gb D: 08/10/2012 01:44:56 ET T: 08/10/2012 04:21:50  ET JOB#: [id number]  cc: Clovis Pu. Lenore Manner, MD, <Dictator> Ellin Saba MD ELECTRONICALLY SIGNED 08/10/2012 7:05

## 2014-04-28 NOTE — Consult Note (Signed)
   Present Illness the patient is an 81 year old female with history of chronic left bundle branch block, history of pulmonary hypertension, sick sinus syatus post  pacemaker as well aent COPD. She had a cardiac catheterization in 2010 revealing normal coronary arteries. She is now admitted with progressive hypoxia. She recently win our officand was relatively hypotensive. This caused dizziness when standing up. She was taken off of torsemide do to this orthostatic change and he did improve the symptoms.Currently she is now admitted with worsening hypoxia.  chest x-ray suggested volume overload as well as probable reactive airway disease. Her oxygen saturations are improving with high oxygen flow. She has ruled out for a myocardial infarction thus far. 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, nyha class IV   Physical Exam:  GEN disheveled   HEENT hearing intact to voice   NECK supple   RESP postive use of accessory muscles  wheezing  rhonchi  crackles   CARD Regular rate and rhythm  Normal, S1, S2  Murmur   Murmur Systolic   Systolic Murmur Out flow   ABD denies tenderness  normal BS   LYMPH negative neck   EXTR negative cyanosis/clubbing, positive edema   SKIN normal to palpation   NEURO cranial nerves intact, motor/sensory function intact   PSYCH A+O to time, place, person, anxious   Review of Systems:  Subjective/Chief Complaint shortness of breath   General: Fatigue   Skin: No Complaints   ENT: No Complaints   Eyes: No Complaints   Neck: No Complaints   Respiratory: Short of breath   Cardiovascular: Dyspnea   Gastrointestinal: No Complaints   Genitourinary: No Complaints   Vascular: No Complaints   Musculoskeletal: No Complaints   Neurologic: No Complaints   Hematologic: No Complaints   Endocrine: No Complaints   Psychiatric: No Complaints   Review of Systems: All other systems were reviewed and found to be negative   Medications/Allergies Reviewed  Medications/Allergies reviewed   EKG:  Interpretation paced rhythm    Lithium: Other  Norvasc: Unknown   Impression -year-old female with history of oxygen-dependent COPD, history of congestive heart failure who is now admitted with progressive hypoxia. She has improved with diuresis and aggressive bronchodilator therapy. She had been taken off of her diuretics as an outpatient due to dizziness and orthostatic hypotension. CXR revealed copd changes as well as volume overload. She has histoyr of pulmonary fibrosis. Agree with carefull use of diuresis to treat her acute on chronic diastollic chf.   Plan 1. Careful diuresis following creatinine and hemodynamics 2. Agressive treatment of her copd  3. Will follow with you for treatment of her acute on chronic diastollic chf   Electronic Signatures: Teodoro Spray (MD)  (Signed 10-Oct-14 13:25)  Authored: General Aspect/Present Illness, History and Physical Exam, Review of System, Home Medications, EKG , Allergies, Impression/Plan   Last Updated: 10-Oct-14 13:25 by Teodoro Spray (MD)

## 2014-04-28 NOTE — Discharge Summary (Signed)
PATIENT NAME:  Bianca Gomez, Bianca Gomez MR#:  [id number] DATE OF BIRTH:  1927-02-01  ADMITTING PHYSICIAN: Epifanio Lesches, MD DISCHARGING PHYSICIAN: Gladstone Lighter, MD PRIMARY CARE PHYSICIAN: Ronette Deter, MD PRIMARY PULMONOLOGIST: Wallene Huh, MD  PRIMARY CARDIOLOGIST: Bartholome Bill, MD  [place]:  1.  Palliative care consultation by Dr. Izora Gala Phifer.  2.  Cardiology consultation with Dr. Ubaldo Glassing.  3.  Pulmonary consultation by Dr. Raul Del.   DISCHARGE DIAGNOSES:  1.  Acute respiratory failure.  2.  Acute-on-chronic diastolic congestive heart failure exacerbation.  3.  Systemic inflammatory response syndrome secondary to bronchitis and early pneumonia.  4.  Pulmonary fibrosis.  5.  Chronic respiratory failure, on 3 liters home oxygen.  6.  Chronic obstructive pulmonary disease exacerbation.  7.  Depression and anxiety.  8.  Hypothyroidism.  9.  Hypertension.  10.  History of breast cancer.  11.  History of sick sinus syndrome, status post pacemaker placement.   DISCHARGE MEDICATIONS:  1.  Flonase nasal spray 2 sprays both nostrils once a day.  2.  Levothyroxine 125 mcg p.o. daily.  3.  Oxcarbazepine 300 mg p.o. q.a.m.  4.  Oxcarbazepine 600 mg p.o. in the evening.  5.  Aspirin 81 mg p.o. daily.  6.  Roxanol 20 mg/mL concentrated liquid - 0.25 mL q.4 hours p.r.n. for dyspnea, tachypnea or pain.  7.  Sertraline 100 mg p.o. daily.  8.  Xanax 0.25 mg p.o. daily q.6 hours p.r.n. for anxiety.  9.  Ambien 5 mg at bedtime as needed for sleep.  10.  Potassium chloride 20 mEq p.o. b.i.d.  11.  Lasix 60 mg p.o. twice a day.  12.  Prednisone taper.  13.  DuoNebs 3 mL q.6 hours p.r.n. for wheezing or shortness of breath.   HOME OXYGEN: 3 liters.   DISCHARGE DIET: Low-sodium diet.   DISCHARGE ACTIVITY: As tolerated.   FOLLOWUP INSTRUCTIONS:  1.  PCP follow-up in 1 to 2 weeks.  2.  Follow up with Dr. Raul Del in 2 weeks.  3.  Home hospice services.    LABORATORIES AND IMAGING STUDIES PRIOR TO DISCHARGE: WBC 13.1, hemoglobin 13.3, hematocrit 39.7, platelet count 251.   Sodium 134, potassium 4.0, chloride 98, bicarbonate 31, BUN 23, creatinine 0.76, glucose 122, and calcium of 9.1. Magnesium is 1.7.   Chest x-ray revealing bilateral interstitial and alveolar opacities, could be edema versus interstitial pneumonia.   Troponins remain negative. Blood cultures are negative. Urinalysis negative for any infection. BNP was elevated at 19,806.   BRIEF HOSPITAL COURSE: Bianca Gomez is an 81 year old pleasant Caucasian female with past medical history significant for diastolic CHF, stage IV emphysema with pulmonary fibrosis on 2 to 3 liters home oxygen, history of bipolar disorder, depression and anxiety, who presents to the hospital secondary to hypoxia and worsening dyspnea.   The patient has been on 2 to 3 liters home oxygen. Was on Lasix for her CHF: however, that was held due to hypotension and dizziness, but was causing more dyspnea at that time, so was seen by Dr. Ubaldo Glassing in the office, and Lasix was restarted. However did not improve her symptoms, so the patient presented to the hospital.   The patient does not have any family. Her only closest people and healthcare power of attorneys are her neighbors, Vaughan Basta and Ron, and have been taking care of the patient.   Acute-on-chronic hypoxic respiratory failure secondary to COPD exacerbation and CHF exacerbation. She was diuresed with Lasix and was seen by Dr. Ubaldo Glassing  and also Dr. Raul Del during the hospital course. She was on steroids, neb treatments as well. The patient gets extremely anxious and her breathing symptoms worse. While she was in the hospital, she was also on high-flow nasal cannula for a couple of days before being transitioned back to 3 L before being transitioned back to nasal cannula, and now she is back on 3 liters. She is physically weak extremely and gets easily dyspneic and hypoxic with  minimal ambulation. However, the patient had been very adamant to go home instead of going to rehab as recommended by physical therapist.   As a physician, I was also worried to send her back home even though she has opted to get hospice services at home because she lives at home by herself and is not physically strong enough even to ambulate within the house. The patient is aware of her decision. She is alert and oriented and she is thinking that her friends, Vaughan Basta and Ron, will take care of her.   There is a good chance that the patient might be admitted back for similar symptoms if this will not work out at home.   I appreciate palliative care help as well.   The patient is being discharged on b.i.d. dose of Lasix with potassium supplements and prednisone taper and neb treatments. She is also on Roxanol and Xanax for symptomatic treatment for her pain, dyspnea and anxiety. She has finished Rocephin and azithromycin course while in the hospital.   Her vitals are otherwise stable and all her other home medications were continued without any changes.   DISCHARGE CONDITION: Guarded with poor prognosis in the long term.   DISCHARGE DISPOSITION: Home with hospice.   TIME SPENT ON DISCHARGE: 45 minutes.   CODE STATUS: DO NOT RESUSCITATE.    ____________________________ Gladstone Lighter, MD rk:np D: 10/20/2012 15:31:48 ET T: 10/20/2012 19:30:10 ET JOB#: [id number]  cc: Gladstone Lighter, MD, <Dictator> Herbon E. Raul Del, MD Javier Docker Ubaldo Glassing, MD Eduard Clos Gilford Rile, MD Gladstone Lighter MD ELECTRONICALLY SIGNED 10/23/2012 12:09

## 2014-04-28 NOTE — Telephone Encounter (Signed)
Refilled per Dr. Thomes Dinning request.

## 2014-04-28 NOTE — Telephone Encounter (Signed)
Refill request approved and sent.

## 2014-04-28 NOTE — Discharge Summary (Signed)
PATIENT NAME:  Bianca Gomez, Bianca Gomez MR#:  [id number] DATE OF BIRTH:  [birth year]  DATE OF ADMISSION:  08/10/2012 DATE OF DISCHARGE:  08/13/2012  PRIMARY CARE PHYSICIAN:  Ronette Deter, MD  DISCHARGE DIAGNOSES: 1.  Right frontal ischemic stroke with petechial hemorrhage.  2.  Hypertension.  3.  Acute on chronic diastolic congestive heart failure.  4.  T wave inversion secondary to cerebrovascular accident.  5.  Pulmonary fibrosis.   IMAGING STUDIES: Include a CT scan of the head done on 08/09/2012, which showed no acute abnormalities.   Repeat CT head done on 08/10/2012 showed an evolving right frontal acute ischemic stroke with petechial hemorrhage small.   Echocardiograms showed ejection fraction of 60% to 58% and diastolic dysfunction.   Carotid ultrasound showed no significant stenosis.   ADMITTING HISTORY AND PHYSICAL: Please see detailed H and P dictated by Dr. Lenore Manner. In brief, an 81 year old Caucasian female patient in her usual health noticed by family that she was drowsy, had some right arm jerking, some slurred speech then she lost consciousness. The patient woke up in 5 minutes and was brought to the ER and admitted to the hospitalist service for syncope, possible CVA and EKG showing new T wave inversions.   HOSPITAL COURSE:  1.  Right frontal CVA. The patient had a repeat CT scan done in 24 hours as an MRI could not be obtained secondary to a pacemaker. The patient had a right frontal acute stroke with small petechial hemorrhage. The case was discussed with Teleneurology who suggested continuing aspirin as the petechial hemorrhage can be a normal finding and Dr.  Shirlean Mylar of teleneurology thought there is no risk for any bleed. The patient had an echocardiogram done which showed no source of embolus. Carotid ultrasound showed no significant stenosis. The patient is on aspirin and statin. Physical therapy saw the patient and suggested the patient be discharged to a skilled nursing  facility where she will be transferred today.  2.  The patient did have hypertension, likely secondary from the stroke which is not being treated presently in the normal range.  3  Pulmonary fibrosis, stable.  4.  Acute on chronic diastolic CHF, mild improved with Lasix.   Today the patient, on examination has some crackles on the lungs, which are chronic. Motor strength is 4/5 in lower extremities 5/5 in upper extremities.   DISCHARGE MEDICATIONS: 1.  Aspirin 81 mg oral once a day.  2.  Zocor 40 mg oral once a day.  3.  Fluticasone 50 mcg nasal spray once a day.  4.  Folic acid 527 mg oral once a day.  5.  Levothyroxine 125 mcg once a day.  6.  Oxcarbazepine 300 mg oral once a day. 7.  Calcium vitamin D 1 tablet oral 2 times a day.  8.  Vitamin C 500 mg oral once a day.  9.  Ambien 5 mg oral once a day.  10.  Gabapentin 100 mg oral once a day at bedtime.  11.  Fosamax 70 mg oral once a week.  12.  Torsemide 10 mg oral once a day.  13.  Sertraline 100 mg 2 tablets oral once a day.  14.  Potassium gluconate 595 mg oral once a day.  15.  Letrozole 2.5 mg oral once a day.  16.  Oxcarbazepine 300 mg 2 tablets orally once a day in the evening.  17.  Acetaminophen hydrocodone 325/5, one tablet oral every 8 hours as needed for pain 18.  MiraLAX 17  grams orally once a day as needed for constipation.   DISCHARGE INSTRUCTIONS: Low-sodium, low-fat diet. Activity as tolerated with assistance. Follow up with primary care physician in 1 to 2 weeks.   Time spent on day of discharge and discharge activity was 43 minutes.     ____________________________ Leia Alf Vasti Yagi, MD srs:dp D: 08/13/2012 11:29:17 ET T: 08/13/2012 11:45:14 ET JOB#: [id number]  cc: Alveta Heimlich R. Shyasia Funches, MD, <Dictator> Eduard Clos. Gilford Rile, MD  Alveta Heimlich Arlice Colt MD ELECTRONICALLY SIGNED 08/23/2012 8:40

## 2014-04-28 NOTE — Consult Note (Signed)
Brief Consult Note: Diagnosis: Syncope, atypical, neg troponin, tele unremarkable, abnormal ECG, but with known normal coronary anatomy.   Patient was seen by consultant.   Consult note dictated.   Comments: REC  Agree with current therapy, cont to monitor closely, consider neuro w/u, review echo.  Electronic Signatures: Isaias Cowman (MD)  (Signed 05-Aug-14 12:19)  Authored: Brief Consult Note   Last Updated: 05-Aug-14 12:19 by Isaias Cowman (MD)

## 2014-04-28 NOTE — Consult Note (Signed)
PATIENT NAME:  Bianca Gomez, BOGGIO MR#:  [id number] DATE OF BIRTH:  01-07-1927  DATE OF CONSULTATION:  08/10/2012  CONSULTING PHYSICIAN:  Isaias Cowman, MD  PRIMARY CARE PHYSICIAN: Eduard Clos. Gilford Rile, MD  CHIEF COMPLAINT: I had slurred speech.   REASON FOR CONSULTATION: Consultation requested for evaluation of syncope.   HISTORY OF PRESENT ILLNESS: The patient is an 81 year old female referred for evaluation of syncope. The patient apparently was in her usual state of health until last evening, when she was sitting with family members and apparently experienced right arm jerking and slurred speech and loss of consciousness for 10 minutes. After the patient regain consciousness, she was confused. She was transported to [facility] Room, where she was noted to be agitated. CT scan of the head was negative. EKG did show T wave inversions inferolateral. The patient does have known normal coronary anatomy by previous cardiac catheterization on 12/27/2008. The patient was admitted to telemetry, where she has ruled out for myocardial infarction by CPK isoenzymes and troponin.   PAST MEDICAL HISTORY:  1. Normal coronary anatomy by cardiac catheterization on 12/27/2008.  2. Sick sinus syndrome, status post pacemaker.  3. Hypertension.  4. History of chronic diastolic congestive heart failure.  5. Peripheral neuropathy.  6. History of breast cancer.  7. Hypothyroidism. 8. Bipolar manic-depressive illness.    MEDICATIONS:  1. Furosemide 10 mg daily.  2. Potassium gluconate 595 mg 1 daily.  3. Aspirin 81 mg daily.  4. Zoloft 200 mg daily.  5. Oyster shell calcium b.i.d. 6. Oxcarbazepine 300 mg q.a.m. and 600 mg q.p.m. 7. Levothyroxine 125 mcg daily.  8. Letrozole 2.5 mg daily.  9. Gabapentin 100 mg at bedtime.  10. Fosamax 70 mg weekly.  11. Folic acid 086 mcg daily.  12. Fluticasone 2 sprays per nostril daily. 13. Ambien 5 mg at bedtime.   SOCIAL HISTORY: The patient currently lives  alone in an apartment.   FAMILY HISTORY: No immediate family history for coronary artery disease or myocardial infarction.   REVIEW OF SYSTEMS:  CONSTITUTIONAL: The patient had some chills last evening.  EYES: No blurry vision.  EARS: No hearing loss.  RESPIRATORY: The patient does complain of exertional dyspnea.  CARDIOVASCULAR: The patient does have intermittent chest discomfort.  GASTROINTESTINAL: The patient has had some persistent nausea. Denies diarrhea or constipation.  GENITOURINARY: No dysuria or hematuria.  ENDOCRINE: No polyuria or polydipsia.  MUSCULOSKELETAL: No arthralgias or myalgias.  NEUROLOGICAL: The patient had slurred speech last evening.  PSYCHOLOGICAL: The patient has bipolar manic-depressive disorder.   PHYSICAL EXAMINATION:  VITAL SIGNS: Blood pressure 161/82, pulse 61, respirations 20, temperature 97.8, pulse oximetry 97%.  HEENT: Pupils equally reactive to light and accommodation.  NECK: Supple without thyromegaly.  LUNGS: Clear.  CARDIOVASCULAR: Normal JVP. Normal PMI. Regular rate and rhythm. Normal S1, S2. No appreciable gallop, murmur or rub.  ABDOMEN: Soft and nontender. Pulses were intact bilaterally.  MUSCULOSKELETAL: Normal muscle tone.  NEUROLOGICAL: The patient is alert and oriented x3. Motor and sensory both grossly intact.   IMPRESSION: An 81 year old female who presents with episode of slurred speech, loss of consciousness and possible seizure disorder. The patient has remained clinically stable, without evidence for cardiac arrhythmia. She has ruled out for myocardial infarction by CPK isoenzymes and troponin. EKG did show T wave inversions, thought the patient does have known normal coronary anatomy by previous cardiac catheterization. The patient did have pulmonary edema on admission, but has known chronic diastolic congestive heart failure.   RECOMMENDATIONS:  1. Agree with overall current therapy.  2. Would defer full dose anticoagulation.   3. Would continue diuresis as needed.  4. Review 2-D echocardiogram.  5. Further recommendations pending the patient's initial clinical course and results of 2-D echocardiogram.    ____________________________ Isaias Cowman, MD ap:OSi D: 08/10/2012 12:17:09 ET T: 08/10/2012 12:44:04 ET JOB#: [id number]  cc: Isaias Cowman, MD, <Dictator> Isaias Cowman MD ELECTRONICALLY SIGNED 09/01/2012 15:59

## 2014-04-28 NOTE — H&P (Signed)
PATIENT NAME:  Bianca Gomez, Bianca Gomez MR#:  [id number] DATE OF BIRTH:  November 24, 1927  DATE OF ADMISSION:  10/14/2012  PRIMARY PHYSICIAN:  Dr. Ronette Deter  EMERGENCY ROOM PHYSICIAN:  Dr. Jasmine December   CHIEF COMPLAINT:  Shortness of breath.   HISTORY OF PRESENT ILLNESS: An 81 year old female patient with history of hypertension, diastolic heart failure, pulmonary fibrosis, chronic respiratory failure on 2 liters of oxygen at home went to Dr. Gust Brooms office because of shortness of breath. The patient's O2 sats were like 79% on room air. The patient recently stopped taking Lasix 2-1/2 weeks ago, but the patient was on torsemide but Dr. Ubaldo Glassing stopped it because she was hypotensive and the patient restarted on torsemide on Monday. The patient was on 10 before; right now she started back on 5 mg of torsemide since Monday but because of shortness of breath, the patient went to Dr. Gust Brooms office, was sent here for that. The patient right now on 6 liters of oxygen saturating 97%, feels like so short of breath. Denies any chest pain. No cough. No fever. Has pedal edema.  PAST MEDICAL HISTORY:  Significant for history of recent admission in August for stroke and she also has a history for chronic diastolic heart failure and chronic respiratory failure secondary to pulmonary fibrosis on 2 liters of oxygen.  History of breast cancer.  History of osteoporosis, hypothyroidism, bipolar disorder and history of depression, hypertension. The patient also has a history of some orthostatic hypotension changes. The patient was discharged to rehab in August after she suffered a frontal lobe stroke. She was at rehab for 6 weeks then went home, and for the past 2 or 3 days because of her trouble breathing, not really walking much.  PAST SURGICAL HISTORY:  Significant for history of bilateral mastectomy, knee joint replacement, hip replacement, shoulder replacement, appendectomy and cholecystectomy. Also includes pacemaker insertion.    SOCIAL HISTORY: No smoking. No drinking. No drugs.   ALLERGIES:  LITHIUM AND ALSO NORVASC.   HOME MEDICATIONS: 1.  Aspirin 81 mg daily.  2.  Flonase 50 mcg 2 sprays once a day. 3.  Levothyroxine 125 mcg p.o. daily. 4.  Oxcarbazepine 300 mg 2 tablets in the evening, 1 tablet in the morning.  5.  Zoloft 100 mg p.o. b.i.d.  REVIEW OF SYSTEMS:  CONSTITUTIONAL: Feels so tired and short of breath. No fever and the patient did not check weight recently.  EYES:  No blurred vision.  ENT:  No tinnitus. No ear pain. No epistaxis. No difficulty swallowing.  RESPIRATORY:  Has shortness of breath, orthopnea and PND for about 1 week, getting worse progressively.  CARDIOVASCULAR:  The patient has chest pain because of shortness of breath.  GASTROINTESTINAL:  No nausea. No vomiting. No diarrhea. No abdominal pain.  GENITOURINARY:  No dysuria.  ENDOCRINE:  No polyuria or nocturia.  INTEGUMENT:  No skin rashes.  MUSCULOSKELETAL:  Has joint pains.  NEUROLOGIC:  Had a stroke in August and was at rehab for 6 weeks, but no weakness or numbness, which are new.  PSYCHIATRIC:  The patient has a history of bipolar disorder.   PHYSICAL EXAMINATION: VITAL SIGNS:  O2 sats were 79% on 4 liters when she came, and other vitals, blood pressure 131/72, pulse 74, respiratory rate of 30; and sats, right now she is on 6 liters sats 97%. Temp 97.Alert, awake, oriented, in moderate respiratory distress because of shortness of breath.  HEENT:  Head normocephalic, atraumatic.  Eyes: Pupils equal, reacting to light. No  conjunctival pallor. No scleral icterus.  Nose:  No turbinate hypertrophy. No drainage. Ears:  No tympanic membrane, congestion. Mouth:  No oropharyngeal lesions.  NECK:  Supple. No JVD. No carotid bruit. Thyroid is not enlarged.  RESPIRATORY:  The patient has bilateral rales all lung fields.  CARDIOVASCULAR:  S1, S2 regular. peripheral pulses intact. The patient does 1+ pitting edema up to the knees.   GASTROINTESTINAL:  Abdomen is soft, nontender, nondistended. Bowel sounds present.  MUSCULOSKELETAL:  Gait not tested because of shortness of breath.  SKIN:  Has no skin rashes.  NEUROLOGIC:  The patient alert, awake, oriented. Cranial nerves II through XII are intact. Power 5/5 in upper and lower extremities. Sensation is intact. DTR 2+ bilaterally.  PSYCHIATRIC:  Judgment, insight are adequate  LABORATORY DATA: Chest x-ray shows cardiomegaly and some interstitial edema with right upper lobe pneumonia. Troponin 0.05, ABG 7.48, pCO2 of 30, pO2 52 and bicarb is 22.3. BNP 15,758. Electrolytes: Sodium 134, potassium 3.8, chloride 102, bicarb 26, BUN 10, creatinine 0.71, glucose 160. LFTs within normal limits. Albumin is 3. WBC 10.9, hemoglobin 12.3,>, platelets 212.   EKG shows normal sinus at 77 beats per minute.   ASSESSMENT AND PLAN: 1.  This is an 81 year old female patient with acute respiratory failure secondary to acute congestive heart failure exacerbation and also possible pneumonia. The patient stopped torsemide 2-1/2 weeks ago and they started 2 or 3 days ago, that is probably the reason for her congestive heart failure exacerbation this time. Continue IV Lasix for her at 60 mg q. 12 hours, along with daily rates. The patient will have low-sodium diet.  The patient had an echocardiogram done in August which showed ejection fraction of 60% to 65%, so we are not going to repeat another echo. Continue IV Lasix and see how she does. Continue oxygen to keep saturations more than 90 and wean off as tolerated. Will get a Foley catheter to monitor urine output. 2.  Acute on chronic respiratory failure secondary to congestive heart failure and also pneumonia. The patient will get Rocephin, Zithromax along with nebulizers. Will add Solu-Medrol and consult with Dr. Raul Del. 3.  History of depression. She is on oxcarbazepine and some Zoloft, so continue that.  4.  Hypothyroidism.  Continue Synthroid.   5.  History of stroke with no neurological deficit.  CODE STATUS:  Discussed with patient and PATIENT IS A DNR.  TIME SPENT:  More than 55 minutes.   Will admit her to telemetry.    ____________________________ Epifanio Lesches, MD sk:ce D: 10/14/2012 12:52:08 ET T: 10/14/2012 13:48:36 ET JOB#: [id number]  cc: Epifanio Lesches, MD, <Dictator> Epifanio Lesches MD ELECTRONICALLY SIGNED 11/23/2012 13:57

## 2014-04-30 NOTE — Discharge Summary (Signed)
PATIENT NAME:  Bianca Gomez, Bianca Gomez MR#:  [id number] DATE OF BIRTH:  Aug 21, 1927  DATE OF ADMISSION:  04/23/2011 DATE OF DISCHARGE:  04/28/2011  DISCHARGE DIAGNOSES: 1. Acute respiratory failure likely due to acute bronchitis with asthmatic component. 2. Neuropathy.  3. History of breast cancer. 4. Osteoporosis. 5. Hypertension. 6. Hypothyroidism. 7. Manic depression disorder. 8. Impaired fasting glucose due to steroid.  DISPOSITION: The patient is being discharged to a rehab facility.   DIET: Low sodium.   ACTIVITY: As tolerated. Continue physical therapy.  DISCHARGE INSTRUCTIONS: Followup with primary care physician, Dr. Ronette Deter, in 1 to 2 weeks after discharge.   DISCHARGE MEDICATIONS:  1. Tylenol 650 mg every four hours p.r.n. pain. 2. Lasix 20 mg twice a day. 3. Percocet 5/325 mg 1 to 2 tablets every six hours p.r.n. severe pain.  4. Symbicort 2 puffs twice a day. 5. Tussionex 5 mL twice a day p.r.n. cough. 6. Levaquin 500 mg daily for three days.  7. DuoNebs every 6 hours p.r.n. shortness of breath and wheezing. 8. Prednisone taper as prescribed.  9. Aspirin 81 mg daily.  10. Calcium 600 mg with vitamin D 200 international units two tablets once a day. 11. Vitamin D/cholecalciferol 2000 international units once a day.  12. Flonase two sprays to each nostril once a day. 13. Folic acid 270 mcg once a day. 14. Letrozole 2.5 mg once a day.  15. Synthroid 125 mcg once a day. 16. Cozaar 100 mg daily.  17. Multivitamin 1 tablet every day. 18. Oxcarbazepine 300 mg in the morning and 600 mg in the evening. 19. Zoloft 100 mg in the morning and two tablets at bedtime.  20. Vitamin C 500 mg daily.   RESULTS: Chest x-ray showed no acute cardiopulmonary pathology.   Echocardiogram: Normal LVF, left ventricular hypertrophy, mild MR and TR and mild AS.   Normal CBC. Elevated Accu-Cheks likely due to steroids. Hemoglobin A1c 5.7. Sodium is ranging from 132 to 133. Normal LFTs.  Normal cardiac enzymes.   HOSPITAL COURSE: The patient is an 81 year old female with past medical history of breast cancer, hypertension, and hypothyroidism who presented with cough and shortness of breath. She was found to have acute respiratory failure due to acute bronchitis with asthmatic component and bronchospasm. She was treated with Symbicort, IV steroids, nebulizer treatments, and antibiotics with improvement. However, she has become very weak and deconditioned. She was evaluated during the hospitalization by physical therapy who recommended rehab, which the patient was agreeable to. She had elevated Accu-Cheks likely due to steroids. Her hemoglobin A1c is 5.7. The rest of her medical problems have remained stable during the hospitalization. She is being discharged to a rehab facility in a stable condition.   TIME SPENT: 45 minutes.  ____________________________ Cherre Huger, MD sp:slb D: 04/28/2011 12:23:00 ET     T: 04/28/2011 12:31:44 ET       JOB#: [id number] cc: Cherre Huger, MD, <Dictator> Eduard Clos. Gilford Rile, MD Cherre Huger MD ELECTRONICALLY SIGNED 04/28/2011 15:08

## 2014-04-30 NOTE — Consult Note (Signed)
General Aspect 81 year old female that was admitted to the hospital for syncope, dyspnea, fatigue.  She first started having syncopal episodes associated with orthostatic hypotension when she was admitted to the hospital 4- 17 through 4-22 with acute respiratory failure.  She was treated with antibiotics and respiratory status stabilized, but she started having syncope with systolics noted in the 98-92 range with a standing position.  She was sent to rehabilitation.  She continued to have these episodes.  However, they worked with her slowly and finally had her on her feet again taking just a few steps.  2 days ago she was walking in the hall and passed out.  This time her blood pressure did not drop, but due to the patient's complaints of dyspnea and fatigue, she presented to the ER.  She was also found to be in new onset atrial flutter with variablel rate, which is rate controlled.  Her echocardiogram in April showed LVH, mild MR, TR and mild aortic stenosis.  CT of the chest showed an interstitial infiltrate, edema versus infection versus inflammation.  Her BNP on admission in April was 413 with t this hospitalization,3049.  She has been started on Midrin for the orthostatic hypotension.  Patient's blood pressures are in the 1 11/15/39 systolic range.  She is not as of yet been out of bed to a standing position.  She does have history of breast cancer and had a cardiac catheterization in December 2010, showing normal coronaries.  She also had a stress Myoview in January of 2012, which showed no evidence of ischemia. Per patient, she is pending an MRI and carotid ultrasound. She does have a few dry crackles in her lung bases.Cardiac enzymes negative.The   Physical Exam:   GEN well developed, no acute distress    HEENT pink conjunctivae    RESP normal resp effort  crackles    CARD Irregular rate and rhythm  Atrial flutter with Variable rate but controlled    ABD denies tenderness  normal BS    EXTR  negative edema    SKIN skin turgor decreased    NEURO cranial nerves intact, motor/sensory function intact    PSYCH alert, A+O to time, place, person   Review of Systems:   Subjective/Chief Complaint Syncope, dyspnea    General: Weakness    Respiratory: Short of breath    Neurologic: polyneuropathy    Medications/Allergies Reviewed Medications/Allergies reviewed   Routine UA:  09-May-13 19:31    Color (UA) Yellow   Clarity (UA) Cloudy   Glucose (UA) Negative   Bilirubin (UA) Negative   Ketones (UA) Negative   Specific Gravity (UA) 1.029   Blood (UA) Negative   pH (UA) 7.0   Protein (UA) Negative   Nitrite (UA) Negative   Leukocyte Esterase (UA) Negative   RBC (UA) <1 /HPF   WBC (UA) <1 /HPF   Bacteria (UA) TRACE   Epithelial Cells (UA) <1 /HPF   Amorphous Crystal (UA) PRESENT  Routine Hem:  10-May-13 01:49    WBC (CBC) 7.9   RBC (CBC) 3.81   Hemoglobin (CBC) 12.2   Hematocrit (CBC) 35.3   Platelet Count (CBC) 136   MCV 93   MCH 32.0   MCHC 34.5   RDW 14.0  Routine Chem:  10-May-13 01:49    Glucose, Serum 97   BUN 9   Creatinine (comp) 0.58   Sodium, Serum 135   Potassium, Serum 3.8   Chloride, Serum 101   CO2, Serum 24  Calcium (Total), Serum 8.2   Osmolality (calc) 269   eGFR (African American) >60   eGFR (Non-African American) >60   Anion Gap 10  Cardiac:  10-May-13 01:49    Troponin I < 0.02  Routine Hem:  10-May-13 01:49    Neutrophil % 80.8   Lymphocyte % 8.8   Monocyte % 8.1   Eosinophil % 2.1   Basophil % 0.2   Neutrophil # 6.4   Lymphocyte # 0.7   Monocyte # 0.6   Eosinophil # 0.2   Basophil # 0.0  Cardiac:  10-May-13 01:49    CPK-MB, Serum 1.5   Radiology Results: CT:    09-May-13 18:21, CT Chest for Pulm Embolism With Contrast   CT Chest for Pulm Embolism With Contrast    REASON FOR EXAM:    sob, hypotension  COMMENTS:       PROCEDURE: CT  - CT CHEST (FOR PE) W  - May 15 2011  6:21PM     RESULT: Chest CT dated  05/15/2011.    Technique: Helical 3 mm sections were obtained from the thoracic inlet   the lung bases status post intravenous ministration of 75 mL of Isovue   370.    Findings: The mediastinum and hilar regions and structures demonstrates   prominent lymph nodes within the prevascular space, paratracheal region   and subcarinal region. Multichamber cardiac enlargement is identified.   There is no evidence of filling defects within the main, lobar, or     segmental pulmonary arteries. The lung parenchyma demonstrates multifocal   diffuse areas of groundglass opacities throughout both lungs as well as   diffuse thickening of interstitial markings. There are regions of sparing   within the lung parenchyma likely representing  areas of emphysematous   change. Does not appear to be CT evidence of focal masses nor nodules.    Visualized upper abdominal viscera demonstrate no gross abnormalities.    IMPRESSION:   1. No CT evidence of pulmonary arterial embolic disease.  2. Interstitial infiltrate edematous versus infectious versus   inflammatory.  3. Multichamber cardiac enlargement  4. Surveillance evaluation recommended status post appropriate   therapeutic regiment.  5. Slightly prominent mediastinal lymph nodes.          Verified By: Mikki Santee, M.D., MD    Lithium: Other  Norvasc: Unknown  Vital Signs/Nurse's Notes: **Vital Signs.:   10-May-13 09:29   Vital Signs Type Q 4hr   Temperature Temperature (F) 98.9   Celsius 37.1   Temperature Source oral   Pulse Pulse 81   Pulse source per Dinamap   Respirations Respirations 18   Systolic BP Systolic BP 622   Diastolic BP (mmHg) Diastolic BP (mmHg) 81   Mean BP 95   BP Source Dinamap   Pulse Ox % Pulse Ox % 95   Pulse Ox Activity Level  At rest   Oxygen Delivery 2L     Impression 81 year old female with history of hospitalization for upper respiratory infection in April,  hypertension, status post mastectomy,  osteoporosis, polyneuropathy, LVH, with recent issues with syncope, dyspnea, with this hospitalization showing new onset atrial flutter with variable rate, increase of BNP from 413  to 3049  at this admission.    Plan 1.  Acute diastolic dysfunction,  which may have been worsened by the onset of atrial flutter. Diuresis is not a good choice at this point due to trying to increase blood pressure to prevent syncope, but gentle diuresis  can be kept as an option if dyspnea  worsens. 2.  New-onset atrial flutter- currently rate controlled.  Would not recommend any other medications at this time for rate management.  Patient is not an anticoagulation candidate  at this time, due to frequent falls. Continue to monitor  for any evidence of sick sinus syndrome which may indicate need for pacemaker. 3.  Syncope - Etiology unclear. Continue with plans for treatment for this. Doubt diastolic dysfunction or atrial flutter is contributing. 4.  No other cardiac diagnostics as indicated this time.  Patient was thoroughly discussed with Dr. Nehemiah Massed, who agreed with the above plan.   Electronic Signatures: Roderic Palau (NP)  (Signed 10-May-13 14:13)  Authored: General Aspect/Present Illness, History and Physical Exam, Review of System, Labs, Radiology, Allergies, Vital Signs/Nurse's Notes, Impression/Plan   Last Updated: 10-May-13 14:13 by Roderic Palau (NP)

## 2014-04-30 NOTE — H&P (Signed)
PATIENT NAME:  Bianca Gomez, Bianca Gomez MR#:  [id number] DATE OF BIRTH:  1927-08-27  DATE OF ADMISSION:  04/23/2011  PRIMARY CARE PHYSICIAN:  Dr. Ronette Deter   CHIEF COMPLAINT: Sent in for shortness of breath, found to have a low pulse oximetry in the doctor's office.   HISTORY OF PRESENT ILLNESS: This is an 81 year old female with 1 to 2 weeks of shortness of breath. She states that she has been panting for breath while she is walking. It was worse this a.m.  She was seen in the doctor's office today. They were unable to get a good pulse oximetry reading, 79%. They gave her some oxygen. She felt awful in the office. She has been having on and off bandlike chest tightness across the chest, which can happen at any time, lasting minutes. It can be as severe as 8 out of 10 in intensity. She was given an inhaler on Monday that seemed to help a little bit. She cannot get a deep breath. She had a fever last week of 101 two to three nights. She has been having sweats. Of note, she did have a negative cardiac catheterization 12/27/2008 by Dr. Ubaldo Glassing. She came over by EMS on 100% nonrebreather but is currently down to a couple of liters. Pulse oximetry initially was 96% on 100% nonrebreather. Hospitalist services were contacted for further evaluation.   PAST MEDICAL HISTORY:  1. Breast cancer, status post mastectomy and hormonal treatment.  2. Polyneuropathy.  3. Osteoporosis.  4. Hypothyroidism.  5. Manic depression.  6. Hypertension.   PAST SURGICAL HISTORY:  1. Mastectomy.  2. Knee replacement.  3. Hip replacement.  4. Shoulder replacement.  5. Gallbladder.  6. Appendix.   ALLERGIES: No known drug allergies   MEDICATIONS:  1. Aspirin 81 mg daily.  2. Calcium and vitamin D 2 tablets daily.  3. Extra vitamin D 2000 international units daily.  4. Cozaar 100 mg daily.  5. Flonase 50 mcg two sprays daily.  6. Folic acid 914 mcg daily.  7. Letrozole 2.5 mg daily.  8. Levothyroxine 125 mcg daily.   9. Multivitamin 1 tablet daily.  10. Oxcarbazepine (Trileptal) 300 mg 1 tablet in the morning and 2 tablets in the evening.  11. Vitamin C 500 mg daily.  12. Zoloft 100 mg 2 tablets at bedtime.   SOCIAL HISTORY: No smoking. No alcohol. No drug use. She does live alone and walks with a walker.   FAMILY HISTORY: Her parents died when she was [age] and she was brought up in an orphanage. She does not know her parents' health history.     REVIEW OF SYSTEMS: CONSTITUTIONAL: Positive for fever. Positive for sweating. Positive for fatigue. Positive for weight loss, decreased appetite. EYES: She does wear glasses. EARS, NOSE, MOUTH, AND THROAT: No hearing loss. No sore throat. No difficulty swallowing. CARDIOVASCULAR: Positive for chest pain, bandlike across the chest. No palpitations. RESPIRATORY: Positive for shortness of breath, dry cough. No sputum. Positive for wheeze. GASTROINTESTINAL: Positive for nausea but no vomiting. No abdominal pain. No diarrhea. No constipation. No bright red blood per rectum. No melena. GENITOURINARY: Positive for joint pain, neuropathy in the feet. INTEGUMENT: No rashes or eruptions. NEUROLOGIC: No fainting or blackouts. PSYCHIATRIC: Positive for manic depression but stable on medications. ENDOCRINE: Positive for hypothyroidism. HEMATOLOGIC/LYMPHATIC: No anemia. No easy bruising or bleeding.   PHYSICAL EXAMINATION:  VITAL SIGNS: Temperature 98.3, pulse 52, respirations 18, blood pressure 172/75, pulse oximetry 97% on 2 liters. When she came in she was  96% on 100% nonrebreather   GENERAL: Slight respiratory distress. Gasping for air in between talking.   EYES: Conjunctivae and lids are normal. Pupils equal, round, and reactive to light. Extraocular muscles intact. No nystagmus.   EARS, NOSE, MOUTH, AND THROAT: Tympanic membrane on the right obscured by wax. Tympanic membrane on the left slight erythema. Nasal mucosa no erythema. Throat no erythema. No exudate seen. Lips and  gums no lesions.   NECK: No JVD. No bruits. No lymphadenopathy. No thyromegaly. No thyroid nodules palpated.   RESPIRATORY: Positive use of accessory muscles while talking, decreased breath sounds bilaterally. Positive wheeze, left greater than right.   CARDIOVASCULAR: S1, S2 normal. No gallops, rubs, or murmurs heard. Carotid upstroke 2+ bilaterally. No bruits.   EXTREMITIES: Dorsalis pedis pulses 1+ bilaterally. 2+ edema of the lower extremity.   ABDOMEN: Soft, nontender. No organomegaly/splenomegaly. Normoactive bowel sounds. No masses felt.   LYMPHATIC: No lymph nodes in the neck.   MUSCULOSKELETAL: 2+ edema. No clubbing. No cyanosis on oxygen.   SKIN: No rashes or ulcers seen.   NEUROLOGIC: Cranial nerves II through XII grossly intact. Deep tendon reflexes are 1+ bilaterally.   PSYCHIATRIC: The patient is oriented to person, place, and time.   LABORATORY, DIAGNOSTIC, AND RADIOLOGICAL DATA: EKG shows normal sinus rhythm, 67 beats per minute, left ventricular hypertrophy. Chest x-ray showed no pneumonia, possibility of congestive heart failure. Glucose 122, BUN 13, creatinine 0.61, sodium 132, potassium 4.2, chloride 95, CO2 28, calcium 8.8. Liver function tests normal. White blood cell count 5.4, hemoglobin and hematocrit 12.8 and 38.5, platelet count 190. Troponin negative. CPK negative. BNP 413.   ASSESSMENT AND PLAN:  1. Acute respiratory failure: Pulse oximetry in the physician's office was 79%, here initially on 100% nonrebreather 96%. Currently on 2 liters. We will obtain an ABG to further assess and continue oxygen supplementation. We will get the ABG on room air.  2. Bronchitis with asthmatic component with wheezing: Could be a cardiac component with increased fluid seen on the chest x-ray. We will obtain an echocardiogram, serial cardiac enzymes to rule out myocardial infarction, but that seems less likely with the cardiac catheterization being negative in December 2010. We  will give a small dose of Lasix IV to see if the patient improves. Could be mild heart failure. Could also be asthmatic bronchitis. We will give Solu-Medrol, nebulizers, and Levaquin.  3. History of neuropathy: Usually walks with a walker. We will get physical therapy evaluation.  4. History of breast cancer: On Femara treatment.  5. Osteoporosis: On calcium and vitamin D.  6. Hypertension; On Cozaar.  7. Hypothyroidism: Continue levothyroxine.  8. Manic depression: On Trileptal and Zoloft.  9. Impaired fasting glucose: We will check a hemoglobin A1c in the a.m.    TIME SPENT ON ADMISSION: 55 minutes.   ____________________________ Tana Conch. Leslye Peer, MD [id number] D: 04/23/2011 15:37:48 ET T: 04/23/2011 16:14:17 ET JOB#: [id number]  cc: Tana Conch. Leslye Peer, MD, <Dictator> Eduard Clos. Gilford Rile, MD Marisue Brooklyn MD ELECTRONICALLY SIGNED 04/28/2011 21:25

## 2014-04-30 NOTE — H&P (Signed)
PATIENT NAME:  Bianca Gomez, Bianca Gomez MR#:  [id number] DATE OF BIRTH:  1927-09-24  DATE OF ADMISSION:  05/15/2011  PRIMARY CARE PHYSICIAN: Ronette Deter, MD   HISTORY OF PRESENT ILLNESS: The patient is an 82 year old Caucasian female with past medical history significant for history of breast carcinoma status post mastectomy as well as hormonal treatment, history of polyneuropathy, osteoporosis, history of hypothyroidism, manic depression disorder, and hypertension who presented to the hospital with complaints of not feeling well and having numerous syncopes. According to the patient, she was doing well up until approximately three weeks ago when she started having problems with cough, wheezing and she was admitted on 04/23/2011 and discharged on 04/28/2011 for what looks like acute respiratory failure, acute bronchitis. Even in the hospital as well as after hospital, she has been having problems with passing out. Apparently she was seen by physical therapist in the hospital and each time whenever she tried to stand up or sit up she would pass out. It would happen intermittently. Her blood pressure would be very low at around 70's or 60's over 50's and she would pass out. She also noted that after she passes out she would have headaches even if she does not hit the floor and is being lowered down by a physical therapist she always has headaches. Usually those headaches are frontal headaches. The patient is not able to move much due to significant concerns of passing out. Yesterday she was walking in the hallway and suddenly she passed out. Last night she also had problems with coughing and had some shortness of breath and decided to come to the Emergency Room for further evaluation. In the Emergency Room, her EKG strip showed atrial flutter with variable ventricular response. She also was noted to be a little hypertensive with systolic blood pressure at 117/68. She had CT scan of her chest done because her D-dimer  was slightly elevated and because of hypertension. It was negative for pulmonary embolism, however, it showed some changes which were concerning for possible interstitial infiltrate which was edematous versus nonedematous as well as multichamber cardiac enlargement and hospitalist services were contacted for admission.   PAST MEDICAL HISTORY:  1. History of multiple medical problems including breast cancer status post mastectomy and hormonal treatment.  2. History of idiopathic polyneuropathy with pains in her upper extremities.  3. Osteoporosis.  4. Hypothyroidism.  5. Manic depression. 6. Hypertension.  7. History of acute respiratory failure and recent admission for the same with acute bronchitis with asthmatic component. During this admission the patient had echocardiogram which showed left ventricular function, LVH, mild MR as well as TR as well as mild aortic stenosis. Never had problems with atrial flutter or atrial fibrillation.   PAST SURGICAL HISTORY:  1. Mastectomy. 2. Knee replacement. 3. Hip replacement. 4. Shoulder replacement. 5. Gallbladder surgery. 6. Appendix surgery.   ALLERGIES: Lithium as well as Norvasc.   MEDICATIONS: Medications are multiple including: 1. Acetaminophen/oxycodone 325 mg/5 mg 1 to 2 tablets every six hours as needed.  2. Albuterol ipratropium 2.5/0.5 mg 3 mL via nebulizer every six hours as needed.  3. Aspirin 81 mg p.o. daily.  4. Chlorpheniramine/hydrocodone 8/10 mg in 5 mL every 12 hours as needed for cough.  5. Fluticasone two sprays once daily for allergies.  6. Folic acid 950 mcg p.o. daily.  7. Letrozole, which is Femara, 2.5 mg p.o. daily.  8. Levothyroxine 125 mcg p.o. daily.  9. MiraLAX 17 grams in water daily as needed.  10.  Oxcarbazepine 300 mg p.o. in the morning, 600 mg at bedtime. 11. Oyster Shell Calcium with Vitamin D 500 mg/200 units 1 tablet twice daily.  12. Prednisone tapering. She has been tapered from 60 mg every two days  x5 mg. She is on 20 mg now. 13. Senna Plus 50/8.6 mg 1 tablet twice daily.  14. Sertraline 200 mg p.o. at bedtime.  15. Symbicort 160/4.5 mg 1 puff twice daily.  16. Tab-A-Vite oral tablet once daily.  17. Tylenol 325 mg p.o. every four hours as needed.  18. Vitamin C 500 mg p.o. daily.  19. Vitamin D3 100 international units 2 tablets once daily.   SOCIAL HISTORY: No smoking, alcohol, or drug abuse. She lives in a facility now. Walks with a walker. She has been walking with a walker for a long period of time.   FAMILY HISTORY: The patient's parents died when she was [age]. She was brought up in an orphanage. She does not know her parents' history.   REVIEW OF SYSTEMS: Positive for feeling hot all the time, feeling chilly intermittently since last night, having fatigue and weakness for a while, weight loss, poor appetite, headaches after passing out, orthostatic hypotension, blood pressure dropping down as soon as she stands up, some blurring of vision intermittently, some cough as well as wheezes which seems to be subsiding now. No sputum production. In the past was colored sputum, now she does not have anymore sputum. She intermittently has shortness of breath as well as chest pains last night but especially shortness of breath as well as dyspnea on exertion especially whenever she moves around. Even with slight less task she would have significant shortness of breath. She had a few syncopal episodes already. She had stress test before her breast carcinoma was diagnosed. At that time she had a cardiac catheterization done two years ago and was negative with clean arteries. Since three weeks ago she has been having intermittent nausea as well as intermittent constipation. She has been managed with medications which she was not ever on before. She has idiopathic polyneuropathy. She has upper extremity pains as well as numbness in her hands as well as feet and very poor balance due to polyneuropathy. She  has been using a walker for the past 7 to 8 years. Denies any high fevers. She does have some new pain in her right lower extremity which is also much weaker than the left lower extremity and it started just recently. No double vision, glaucoma, or cataracts. ENT: Denies any tinnitus, allergies, epistaxis, sinus pain, dentures, difficulty swallowing. RESPIRATORY: Denies any hemoptysis, asthma, COPD. CARDIOVASCULAR: Denies orthopnea, edema, arrhythmias, palpitations. GASTROINTESTINAL: Denies any vomiting, diarrhea. Admits of constipation. Denies any rectal bleeding, change in bowel habits. GENITOURINARY: Denies dysuria, hematuria, frequency, incontinence. ENDOCRINOLOGY: Denies any polydipsia, nocturia, thyroid problems, heat or cold intolerance, or thirst. HEMATOLOGIC: Denies any anemia, easy bruising, swollen glands. SKIN: Denies any acne, rashes, lesions, change in moles. MUSCULOSKELETAL: Denies arthritis, cramps, swelling, gout. NEUROLOGIC: Admits of numbness. Denies any epilepsy or tremor. Admits of having right lower extremity which seems to be weaker and more painful than before. Admits of having some back pains but not significantly enough and her pain is the main issue which precludes her from lifting her right upper extremity up.      PHYSICAL EXAMINATION:   VITAL SIGNS: On arrival to the hospital, her temperature 99, pulse 68, respiration rate 18, blood pressure 117/68, saturation 94% on room air.   GENERAL: This is a  well nourished obese Caucasian female in no significant distress comfortable on the stretcher.   HEENT: Pupils are equal and reactive to light. Extraocular movements intact. No icterus or conjunctivitis. Has normal hearing. No pharyngeal erythema. Mucosa is moist.   NECK: No masses, supple, nontender. Thyroid not enlarged. No adenopathy. No JVD or carotid bruits bilaterally. Full range of motion.   LUNGS: Clear to auscultation. Few wheezes were heard. Few dry crackles heard  around left side of anterior lung, otherwise in the posterior aspect of the lung no significant crackles or wheezes. No diminished breath sounds or wheezing. No labored inspirations, increased effort, dullness to percussion, overt respiratory distress. However, the patient does have some distress whenever she moves around. She is in respiratory distress whenever she tries to move.   CARDIOVASCULAR: S1, S2 appreciated. Irregularly irregular. Mild systolic murmur heard. No significant radiation. PMI not lateralized. Chest is nontender to palpation. 1+ pedal pulses. No lower extremity edema, calf tenderness, or cyanosis.   ABDOMEN: Soft, nontender. Bowel sounds are present. No hepatosplenomegaly or masses were noted.    RECTAL: Deferred.   MUSCULOSKELETAL: Muscle strength able to move all extremities. No cyanosis, degenerative joint disease, or kyphosis. However, the patient's right lower extremity is very weak and she is not able to lift it up even against gravity because of severe pains in her thigh muscle.  SKIN: Skin did not reveal any rashes, lesions, erythema, nodularity, or induration. It was warm and dry to palpation.   LYMPH: No adenopathy in the cervical region.   NEUROLOGICAL: Cranial nerves grossly intact. Sensory is intact. No dysarthria or aphasia.   PSYCHIATRIC: The patient is alert and oriented to time, person, place, cooperative. Memory is good.   PSYCHIATRIC: No significant confusion, agitation, or depression noted.   LABORATORY, DIAGNOSTIC, AND RADIOLOGICAL DATA: BMP showed glucose of 104. B-type Natriuretic Peptide was 3049. Sodium 134, otherwise BMP is unremarkable. The patient's albumin level is 2.9, otherwise unremarkable liver enzymes. Cardiac enzymes, first set, negative. CBC within normal limits except platelet count is low at 143. D-dimer is elevated at 0.85. Urinalysis yellow cloudy urine, negative for glucose, bilirubin, or ketones, specific gravity 1.029, pH 7.0,  negative for blood, protein, nitrites, or leukocyte esterase, less than 1 red blood cell and white blood cell, trace bacteria, less than 1 epithelial cell, amorphous crystal is present.   EKG showed atrial flutter with variable AV block, rate of 68 beats per minute, normal axis, minimal voltage criteria for LVH, may be normal variant according to EKG criteria. Nonspecific ST-T changes were noted as compared to July 2012 EKG which showed sinus rhythm.   CT scan of chest with contrast to rule out pulmonary embolism revealed no CT evidence of pulmonary embolic disease. Interstitial infiltrate edematous versus infectious versus inflammatory was noted. Multichamber cardiac enlargement. Surveillance evaluation recommended status post appropriate therapeutic regimen. Slightly prominent mediastinal lymph nodes were noted.   ASSESSMENT AND PLAN:  1. Orthostatic hypotension of unclear etiology at this time possibly related to arrhythmia, atrial flutter, however, could be related to polyneuropathy. Will start patient on midodrine. The patient may benefit from fludrocortisone in the future if her orthostatic hypotension does not improve. Will get orthostatic vital signs frequently.  2. Syncope, possibly related to orthostatic hypertension. Get carotid ultrasound if it was not done recently.  3. Dyspnea, questionable cardiac due to atrial flutter. The patient is possibly not tolerating atrial flutter. Will get cardiologist involved for recommendations. Will start the patient on low dose of metoprolol. Will  check TSH.  4. Right lower extremity weakness of unclear etiology. Will get MRI of brain done and also Neurology consultation will be requested.  5. Atrial flutter, seems to be rate controlled at this time, however, the patient's heart rate could be increasing significantly or decreasing and that is why she is having syncopal episodes. I will be concerned about starting her on metoprolol but maybe will not initiate  at this time any beta-blockers. Will follow Cardiology recommendations. Will get cardiologist involved for recommendations. Will get TSH checked. Echocardiogram was recently done. It showed mild aortic stenosis as well as minimal changes except of LVH, otherwise no significant changes were noted except for mild MR as well as TR. The patient would benefit overall from metoprolol but I'm not really sure if this would not lead to significant pauses and bradycardia Will watch the patient's heart rate carefully and will ask cardiologist to evaluate and make decisions about beta-blockers or any other medication.  6. History of hypothyroidism. Continue patient on Synthroid. Check TSH.  7. History of breast carcinoma. Continue folic acid as well as Letrozole. 8. History of constipation. Continue outpatient medications such as MiraLAX as well as Senna. 9. History of bipolar disorder. Continue Oxcarbazepine.   10. History of polyneuropathy. Will continue pain medications as well as physical therapy.   TIME SPENT: One hour.    ____________________________ Theodoro Grist, MD rv:drc D: 05/15/2011 21:43:12 ET T: 05/16/2011 06:47:56 ET JOB#: [id number]  cc: Theodoro Grist, MD, <Dictator> Anderson Malta A. Gilford Rile, MD Theodoro Grist MD ELECTRONICALLY SIGNED 05/17/2011 17:09

## 2014-04-30 NOTE — Consult Note (Signed)
PATIENT NAME:  Bianca Gomez, Bianca Gomez MR#:  [id number] DATE OF BIRTH:  August 23, 1927  DATE OF CONSULTATION:  05/16/2011  REFERRING PHYSICIAN:  Dr. Ether Griffins CONSULTING PHYSICIAN:  Rudell Cobb. Loletta Specter, MD  HISTORY: Bianca Gomez is an 81 year old right-handed widowed white patient of Dr. Ronette Deter with history of bipolar disorder, hypertension, paroxysmal atrial fibrillation versus intermittent atrial fibrillation seen on Holter monitor study December 2010, chest pain found associated with normal coronaries on catheterization December 2010, obesity, hypothyroidism, reactive airway disease triggered by acid reflux, idiopathic peripheral neuropathy, breast cancer status post bilateral mastectomy surgeries, remote pancreatitis, osteoarthritis status post right total hip replaced 04/26/2002, right total knee replacement October 2003, right total knee replacement surgery complicated by infection, left shoulder joint replacement 2007, and left foot surgery with bone fusion 2006, recent problems with syncope, raising question of orthostatic hypotension. She was last admitted at [facility] 04/23/2011 and discharged 04/28/2011 to rehab at [facility] with admission for acute respiratory failure secondary to bronchitis and asthma/bronchospasm. She is now admitted 05/15/2011 after presenting with problems with passing out. She is referred for evaluation of right lower extremity pain and weakness. History comes from the patient, her hospital chart, her hospital records, and outpatient neurology evaluation of peripheral neuropathy by Dr. Jennings Books of [facility] neurology August 2011 and followup February 2012.   Today she reports six day history of problems with pain of the right anterior thigh with use of the leg, weakness of flexion of the leg at the hip and extending the knee, noted first on awakening in the morning with no history of fall or other injury the day before. In the past alone, living alone, she has used a rolling walker  since 2007, but at [facility], she has been working with her therapist with parallel bars, reporting walking painful to the right anterior thigh area for the past six days. She denies any prior similar problems. She has no history of diabetes, but has had elevation of blood sugars on prednisone taper started during her most recent hospitalization. Brain MRI scan was performed today with benign findings; formal report is pending. Of note, in the Emergency Room she had orthostatic vital signs checked with normal findings, mean arterial pressure 82 with heart rate 69 lying, pressure 84 with heart rate 75 seated, mean arterial pressure 94 with heart rate 73 standing.   PHYSICAL EXAMINATION: The patient is a well-developed and overweight elderly white woman who was pleasant and cooperative in no apparent distress, examined lying semisupine with monitor blood pressure 120/75 and heart rate 80. There was no fever. She was normocephalic without evidence of trauma and her neck was supple with decrease of cervical range of motion for age. Mental status was normal, though cognitive testing was not performed in detail. She was alert and oriented with clear speech and normal expression, and she was lucid and a good historian with normal affect. Cranial nerve examination showed symmetric facial appearance at rest and with directed movements in conversation, normal eye movements, and full visual field to finger count for each eye; visual acuity was not tested. Motor examination of the lower extremities was limited by anterior thigh pain on the right, but strength was overall grossly rated mild to moderately decreased for right hip flexion and knee extension. Strength in the right lower extremity and left lower extremity was otherwise intact. There was some tenderness of the left anterior thigh to pressure. She denied asymmetry of sensory testing about the thighs. Patellar flexion was trace on the right  and 2+ on the left; Achilles  reflexes were trace to 1+ bilaterally. Of note, when she was seen by Dr. Manuella Ghazi in August 2011, patellar reflexes were symmetric and rated at that time 1+.   IMPRESSION: Her clinical picture appears most consistent with subacute pain of the left anterior thigh and weakness of hip flexion and knee extension from a mononeuritis of the right femoral nerve. I suspect this is secondary to minute infarction of the nerve. I have lower suspicion that there is femoral nerve involvement from a mass process in the pelvis.   RECOMMENDATIONS:  1. She will be scheduled for CT imaging of the pelvis with contrast.  2. Physical therapy evaluation.  3. Hold for now on evaluation with nerve conduction and EMG testing.   I appreciate being asked to see this pleasant and interesting lady.    ____________________________ Rudell Cobb. Loletta Specter, MD prc:bjt D: 05/16/2011 17:17:25 ET T: 05/17/2011 13:39:48 ET JOB#: [id number]  cc: Rudell Cobb. Loletta Specter, MD, <Dictator> Eduard Clos. Gilford Rile, MD Linton Flemings MD ELECTRONICALLY SIGNED 05/19/2011 16:41

## 2014-04-30 NOTE — Op Note (Signed)
PATIENT NAME:  Bianca Gomez, Bianca Gomez MR#:  [id number] DATE OF BIRTH:  04-16-1927  DATE OF PROCEDURE:  05/28/2011  PREPROCEDURE DIAGNOSIS: Sick sinus syndrome.   PROCEDURE: Single-chamber pacemaker generator implantation.   POSTPROCEDURE DIAGNOSIS: Intermittent ventricular pacing.   INDICATION: The patient is an 81 year old female who was admitted on 05/15/2011 primarily for hypotension and syncope. During the hospitalization, the patient was noted to be in atrial fibrillation and atrial flutter with bradycardia, chronotropic incompetence, and up to 3 to 4 second pauses. The procedure, risks, benefits, and alternatives of permanent pacemaker implantation were explained to the patient and informed written consent was obtained.   DESCRIPTION OF PROCEDURE: She was brought to the operating room in a fasting state. The left pectoral region was prepped and draped in the usual sterile manner. Anesthesia was obtained with 1% lidocaine locally. A 6 cm incision was performed over the left pectoral region. The pacemaker pocket was generated by electrocautery and blunt dissection. Access was obtained to the left subclavian vein by fine needle aspiration. A Medtronic ventricular lead was positioned in the right ventricular apex. The pacemaker pocket was irrigated with gentamicin solution. The lead was connected to a single-chamber rate responsive pacemaker generator and positioned in the pocket. The pocket was closed with 2-0 and 4-0 Vicryl, respectively. Steri-Strips and pressure dressing were applied. ____________________________ Isaias Cowman, MD ap:slb D: 05/28/2011 13:28:36 ET T: 05/28/2011 14:04:15 ET JOB#: [id number]  cc: Isaias Cowman, MD, <Dictator> Isaias Cowman MD ELECTRONICALLY SIGNED 06/19/2011 17:40

## 2014-04-30 NOTE — Discharge Summary (Signed)
PATIENT NAME:  Bianca Gomez, Bianca Gomez MR#:  [id number] DATE OF BIRTH:  [birth year]  DATE OF ADMISSION:  05/15/2011 DATE OF DISCHARGE:  05/30/2011  ADMITTING DIAGNOSIS: Syncope.   DISCHARGE DIAGNOSES:  1. Syncope, likely orthostatic hypotension related.  2. Chronotropic heart  failure, status post permanent pacemaker placement on 05/29/2011 by Dr. Saralyn Pilar.  3. Atrial flutter, new diagnosis, with slow ventricular response.  4. Congestive heart failure, left heart, acute on chronic, diastolic. 5. Dyspnea, hypoxia related to congestive heart failure. 6. Hypokalemia.  7. Hyponatremia, questionable syndrome of inappropriate antidiuretic hormone.  8. Dizziness.  9. DO NOT RESUSCITATE.   10. History of  bipolar disorder. 11. Breast carcinoma.  12. Idiopathic neuropathy.  13. Osteoporosis.  14. Hypothyroidism.  15. Hypertension.   DISCHARGE CONDITION: Stable.   DISCHARGE MEDICATIONS: The patient is to resume her outpatient medications which are:  1. Fluticasone 2 sprays once daily.  2. Folic acid 253 mcg p.o. daily.  3. Letrozole 2.5 mg p.o. daily.  4. Oxcarbazepine 300 mg tablet,  600 mg, which is 2 tablets at bedtime.  5. Oxcarbazepine 300 mg p.o. in the morning.  6. Calcium 500 with vitamin D 200, 1 tablet b.i.d.    7. Sertraline 200 mg p.o. daily at bedtime.  8. Multivitamin once daily.  9. Vitamin D3, 1000 units, 2 tablets, which would be 2000 units orally daily.  10. Albuterol ipratropium solution, 3 mL via nebulizer every 6 hours as needed.  11. Tylenol 650 mg p.o. every 4 hours as needed.  12. MiraLax 17 grams orally daily as needed.  13. Tussionex 5 mL b.i.d.  14. Symbicort 160/4.5, 1 puff b.i.d.   15. Vitamin C 500 mg p.o. daily.   ADDITIONAL MEDICATIONS:  1. Tylenol #3 with codeine, 1 to 2 tablets every 4 hours as needed.  2. Xanax 0.25 mg p.o. every 8 hours as needed.  3. Dulcolax 10 mg p.o. at bedtime as needed.  4. Robitussin AC 10 mL every 4 hours as needed.  5. Senokot  S 1 tablet p.o. b.i.d. as needed.  6. Florinef 0.1 mg p.o. daily.  7. Milk of magnesia 30 mL p.o. at bedtime as needed.  8. Mucinex 600 mg p.o. b.i.d. 9. Antivert 25 mg p.o. t.i.d.  10. Midodrine 10 mg p.o. t.i.d.  11. MiraLax 17 grams p.o. daily.  12. Juice or water as needed.  13. Spiriva 1 capsule inhalation daily.  14. Lasix 20 mg p.o. daily.  15. K-Dur 20 mEq p.o. daily.  16. Duke's Mouthwash 15 mL every 6 hours as needed.  17. Aspirin 325 mg p.o. daily.  18. Prednisone 10 mg p.o. once on 05/31/2011.   19. Levothyroxine 125 mcg p.o. daily dose.  NOTE: The patient is not to take Synthroid until recommended by primary care physician. TSH should be checked in the next 2 to 4 weeks after discharge and make decision about starting Synthroid at that point.   OXYGEN: Portable tank at 1 liter of oxygen through nasal cannula.   DIET: 2 grams salt.   ACTIVITY LIMITATIONS: As tolerated.   REFERRALS: Physical Therapy 2 to 7 times a week.   FOLLOWUP: Follow-up appointment with Dr. Ronette Deter in 2 days after discharge.   CONSULTANTS:  1. Care Management. 2. Palliative Care.  3. Dr. Serafina Royals. 4. Dr. Isaias Cowman. 5. Dr. Wallene Huh.  LABORATORY, DIAGNOSTIC AND RADIOLOGICAL DATA:   Lumbar spine AP and lateral 05/15/2011: Degenerative changes in the lumbar spine with narrowing of upper lumbar disks. No  evidence of acute compression fracture.  CT of chest for pulmonary embolism with IV contrast on 05/15/2011: No evidence of pulmonary arterial embolic disease. Interstitial infiltrate edematous versus infectious versus inflammatory, multichamber cardiac enlargement, surveillance evaluation recommended status post appropriate therapeutic regimen. Slightly prominent mediastinal lymph nodes were noted.  MRI of brain without contrast on 05/16/2011 showed chronic ischemic change, no acute abnormality.  Carotid ultrasound 05/16/2011 showed no evidence of hemodynamically  significant stenosis in extracranial carotid arteries.  No echocardiogram was performed during this admission.  CT of pelvis showed small, fat-filled umbilical hernia. No definite presacral or pelvic mass noted. CT of femur/thigh on the right side without contrast 05/18/2011 showed no focal abnormal fluid collection or solid mass.  Chest portable single view 05/18/2011 showed pulmonary edema. Mild cardiomegaly was suggested, but the left heart border was poorly seen, raising the possibility of motion artifact and even atelectasis. Infiltrate was less likely, according to the radiologist.  Repeated chest x-ray, portable single view, 05/21/2011 showed persistent diffuse thickening of interstitial and alveolar lung markings bilaterally most compatible with edema but is now slightly more prominent than on previous study. No consolidated pulmonary infiltrates were evident. No pleural effusions were seen. Heart is upper limits of normal in size, according to the radiologist. Repeat chest x-ray, portable single view, on 05/25/2011 showed interstitial opacities are similar to prior. This may represent interstitial pulmonary edema. In the chronic setting, this could represent chronic interstitial lung disease.  Repeat portable single view chest x-ray on 05/28/2011 after pacemaker placement: Findings consistent with diffuse interstitial edema versus pneumonitis, hyperinflation, possible mild cardiomegaly noted.  CT of chest without contrast: Significant pulmonary fibrotic disease, decreasing ground-glass densities scattered in both lungs. No focal consolidation, effusion, or pneumothorax noted. No discrete mass.   HOSPITAL COURSE: The patient is a 81 year old Caucasian female who presented to the hospital on 05/15/2011 with a syncopal episode. She also had shortness of breath and orthostatic hypotension, and apparently had frequent syncopal episodes with decreasing blood pressures just prior to fall. On arrival to  Emergency Room, she was noted to have a new EKG showing atrial flutter with a rate of 60s and variable ventricular response. CT of the chest showed interstitial edema and dilated all cardiac chambers. The patient was also complaining of right lower extremity pain as well as weakness. She was admitted to the hospital with a diagnosis of orthostatic hypotension-related syncopal episode, and she was worked up first in regards to syncopal episode. Initially it was felt that this was related to orthostatic hypotension. The patient was treated for orthostatic hypotension with IV fluids; however, we were not able to do significant fluid load due to congestive heart failure, acute on chronic diastolic exacerbation for which the patient required, in fact, Lasix due to worsening shortness of breath. For this reason, the patient's orthostatic hypotension was treated with midodrine initially with advancing doses of midodrine, and finally Florinef was added with variable success. The patient is to continue high doses of midodrine as well as  advancing doses of Florinef, if needed, and make decisions about further therapy with primary care physician. It is very difficult to control her orthostatic hypotension as she also needs diuretics for her congestive heart failure. It was also felt by the cardiologist initially that orthostatic hypotension was to blame; however, later on as the patient was showing pauses of up to 4 seconds on her telemetry, chronotropic heart failure was also implicated in her syncopal episodes; so, a permanent pacemaker was placed on 05/28/2011,  and postprocedure she did not have any complications. She was evaluated by a physical therapist who felt that the patient would benefit from returning back to a skilled nursing facility for physical therapy. She is to follow up with her cardiologist, Dr. Nehemiah Massed, in the next few days after discharge.   In regards to congestive heart failure, acute on chronic  diastolic, and dyspnea as well as hypoxia related to congestive heart failure, the patient was continued on diuretics, but we were was not able to give the patient high doses of diuretics due to orthostatic hypotension problems. She is to continue Lasix as well as potassium supplements and make decisions about advancement Lasix if needed. She is to continue a low dose of oxygen as well. Initially on arrival to the hospital, she required quite high oxygen load, even high-flow nasal cannulas. Later on, she was decreased to 6 liters of oxygen through nasal cannula and remained on 6, then 4 liters of oxygen for a long period of time, until just recently she was on 1 to 2 liters of oxygen through nasal cannulas,  and her oxygen saturation remained stable at 94 to 95% on those 2 liters of oxygen. She is to continue oxygen and follow up with her primary care physician to make decisions about other therapies.   She was evaluated by Dr. Raul Del while in the hospital on 05/21/2011. Dr. Raul Del felt that the patient had persistent bronchospasm,  possible reactive airway disease, does not appear to be  in cardiac asthma, although her chest x-ray showed pulmonary edema. He did not feel that it was Venturi effect  from endobronchial lesion at this time and no pulmonary embolism on CT scan objective evidence to suggest pulmonary hypertension. He recommended to continue Symbicort, as well as Ventolin, as well as steroids and follow up with a pulmonologist if needed. The patient had a CT scan repeated on 05/30/2011 which showed improvement of her ground-glass densities in both lungs.   In regards to hyponatremia, the patient was noted to be hyponatremic in the hospital as well as hypokalemic, but the hypokalemia was related to diuresis; however, hyponatremia was evaluated and also osmolalities of urine as well as serum were taken; however, because of Lasix dosing it was unclear if those osmolalities are at all reliable. The  patient had osmolalities which would be suggestive for SIADH; however, again she was on Lasix and it is unclear if is reliable at this time. She is to follow up with her primary physician and follow with her hyponatremia as well as hypokalemia as an outpatient.   The patient was evaluated by Palliative Care, and she requested to be DO NOT RESUSCITATE.   For her bipolar disorder, history of breast carcinoma, idiopathic neuropathy, osteoporosis, hypothyroidism as well as history of hypertension, the patient is to continue her outpatient medications. Of note, the patient was evaluated by a neurologist while in the hospital for her neuropathy. Multiple radiologic studies were performed, and the last time the patient was seen by a neurologist, Dr. Steele Berg, on 05/18/2011. At that time, the Dr. 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 Physical therapy was recommended as well. The patient did quite well with physical therapy, and she is being discharged to a skilled nursing facility to continue physical therapy.   In regards to hypothyroidism, the patient's TSH was found to be normal. She is to continue her usual doses of Synthroid. No changes were made in her thyroid hormone dosing.  CONDITION ON DISCHARGE: The patient is being discharged in stable condition with the above-mentioned medications as well as follow-up. Vital signs on the day of discharge: Temperature 98.7, pulse 85, respiration rate 18, blood pressure 142/80, saturation 95% on 2 liters of oxygen through nasal cannula.  TIME SPENT: 40 minutes.    ____________________________ Theodoro Grist, MD rv:cbb D: 05/30/2011 14:23:05 ET T: 05/30/2011 15:32:28 ET JOB#: [id number]  cc: Theodoro Grist, MD, <Dictator> Eduard Clos. Gilford Rile, MD Theodoro Grist MD ELECTRONICALLY SIGNED 06/07/2011 15:41

## 2014-04-30 NOTE — Consult Note (Signed)
    Comments   I spoke with pt's friends Sheppard Penton and Linda Martinique who are at pt's bedside. Informed Ms Hervey Ard that pt had stated she wanted her to be her HCPOA. Ms Hervey Ard seemed uncomfortable with this plan and was not sure that she could take on that responsibility. She hoped that Ms Martinique could be designee. They said that pt's lawyer was to come to the hospital to complete both Living Will and HCPOA and I encouraged them to discuss these issues with pt. I informed them that pt had made herself a DNI and they thought this was appropriate. Agreed that Ms Martinique will be primary telephone contact (# in SCM).  Electronic Signatures: Danni Shima, Izora Gala (MD)  (Signed 14-May-13 15:16)  Authored: Palliative Care   Last Updated: 14-May-13 15:16 by Timber Marshman, Izora Gala (MD)

## 2014-04-30 NOTE — Consult Note (Signed)
    Comments   CXR shows worsening pulmonary edema. Still on VM 55% with Spo2 93%. She is sad and concerned that hospice was mentioned today in regards to discharge planning. I spoke briefly with her now about this possibility for future care but she makes it clear that right now her goal is for continued medical mgt in the hope her clinical status will improve.  give lasix 40mg  IV x 1. Will have RN place foley so that we may better track I&O with pt's incontinence. Pt agreeable to this plan.   Electronic Signatures for Addendum Section:  Phifer, Izora Gala (MD) (Signed Addendum [phone] 20:51)  Discussed with Billey Chang, NP, in detail. Agree with assessment and plan as outlined in above note.   Electronic Signatures: Borders, Kirt Boys (NP)  (Signed 15-May-13 13:33)  Authored: Palliative Care   Last Updated: 15-May-13 20:51 by Phifer, Izora Gala (MD)

## 2014-05-01 NOTE — Telephone Encounter (Signed)
Faxed to pharmacy

## 2014-05-05 ENCOUNTER — Other Ambulatory Visit: Payer: Self-pay | Admitting: *Deleted

## 2014-05-05 ENCOUNTER — Telehealth: Payer: Self-pay

## 2014-05-05 MED ORDER — PREDNISONE 10 MG (21) PO TBPK: 10.0000 mg | ORAL_TABLET | Freq: Every day | ORAL | Status: AC

## 2014-05-05 NOTE — Telephone Encounter (Signed)
Please see below request  

## 2014-05-05 NOTE — Telephone Encounter (Signed)
Antibiotic that was sent last week did not help pt, still has the same symptoms (shortness of breath, crackles, wheezing and yellow/greenish sputum).

## 2014-05-05 NOTE — Telephone Encounter (Signed)
Rx sent to pharmacy, notified hospice nurse

## 2014-05-05 NOTE — Telephone Encounter (Signed)
Why does she want a prednisone taper?

## 2014-05-05 NOTE — Telephone Encounter (Signed)
Seth Bake from hospice called and is hoping to get an order for a prednisone taper Callback - [phone]

## 2014-05-05 NOTE — Telephone Encounter (Signed)
Prednisone 10mg  tablets. Start 60mg  po day 1, then taper by 10mg  daily until gone. #21.

## 2014-05-08 ENCOUNTER — Telehealth: Payer: Self-pay | Admitting: *Deleted

## 2014-05-08 ENCOUNTER — Other Ambulatory Visit: Payer: Self-pay | Admitting: *Deleted

## 2014-05-08 MED ORDER — MORPHINE SULFATE ER 15 MG PO TBCR: EXTENDED_RELEASE_TABLET | ORAL | Status: AC

## 2014-05-08 NOTE — Telephone Encounter (Signed)
Vickie from Hospice called states Seth Bake relayed to her that Dr Gilford Rile did not want pt on Gabapentin, however Rx was refilled on 4.15.16.  Vickie requests whether Dr Gilford Rile wants pt to taper off the medication if so she needs tapering directions.  She is also requesting a refill on Morphine Sulfate with a dosage increase to 1 tab on the am and 2 tabs in the pm.  Please advise

## 2014-05-08 NOTE — Telephone Encounter (Signed)
Spoke with Vickie, advised of MDs message.  Vickie verbalized understanding.  rx prepared to be sent to pharmacy for refill

## 2014-05-08 NOTE — Telephone Encounter (Signed)
If she is taking Gabapentin, only 100mg  bid, then she can just stop the medication. Fine to increase dose as noted on MS Contin.

## 2014-06-07 DEATH — deceased
